# Patient Record
Sex: Male | Born: 1955 | Race: White | Hispanic: No | Smoking: Never smoker
Health system: Southern US, Community
[De-identification: ages and names within clinical notes are randomized; demographics above are authoritative.]

## PROBLEM LIST (undated history)

## (undated) DIAGNOSIS — Z86010 Personal history of colonic polyps: Secondary | ICD-10-CM

## (undated) DIAGNOSIS — R011 Cardiac murmur, unspecified: Secondary | ICD-10-CM

## (undated) DIAGNOSIS — E785 Hyperlipidemia, unspecified: Secondary | ICD-10-CM

## (undated) DIAGNOSIS — I10 Essential (primary) hypertension: Secondary | ICD-10-CM

## (undated) HISTORY — DX: Hyperlipidemia, unspecified: E78.5

## (undated) HISTORY — DX: Essential (primary) hypertension: I10

## (undated) HISTORY — DX: Personal history of colonic polyps: Z86.010

## (undated) HISTORY — PX: APPENDECTOMY: SHX54

## (undated) HISTORY — DX: Cardiac murmur, unspecified: R01.1

---

## 2006-08-21 ENCOUNTER — Ambulatory Visit: Payer: Self-pay | Admitting: Internal Medicine

## 2006-08-23 ENCOUNTER — Ambulatory Visit: Payer: Self-pay

## 2006-08-23 ENCOUNTER — Encounter: Payer: Self-pay | Admitting: Cardiology

## 2006-10-12 ENCOUNTER — Ambulatory Visit: Payer: Self-pay | Admitting: Internal Medicine

## 2006-10-12 LAB — CONVERTED CEMR LAB
ALT: 25 units/L (ref 0–40)
Albumin: 4.4 g/dL (ref 3.5–5.2)
Alkaline Phosphatase: 77 units/L (ref 39–117)
Bilirubin Urine: NEGATIVE
Glucose, Bld: 95 mg/dL (ref 70–99)
HDL: 64.2 mg/dL (ref >39.0)
LDL DIRECT: 154.5 mg/dL
Leukocytes, UA: NEGATIVE
Nitrite: NEGATIVE
Potassium: 4.2 meq/L (ref 3.5–5.1)
RBC: 5.24 M/uL (ref 4.22–5.81)
RDW: 13.1 % (ref 11.5–14.6)
Sodium: 141 meq/L (ref 135–145)
Specific Gravity, Urine: 1.025 (ref 1.000–1.03)
Total Bilirubin: 0.9 mg/dL (ref 0.3–1.2)
Total Protein, Urine: NEGATIVE mg/dL
Total Protein: 7.3 g/dL (ref 6.0–8.3)
Triglyceride fasting, serum: 63 mg/dL (ref 0–149)
Urine Glucose: NEGATIVE mg/dL
VLDL: 13 mg/dL (ref 0–40)
pH: 6.5 (ref 5.0–8.0)

## 2006-10-17 ENCOUNTER — Ambulatory Visit: Payer: Self-pay | Admitting: Internal Medicine

## 2006-11-26 ENCOUNTER — Ambulatory Visit: Payer: Self-pay | Admitting: Internal Medicine

## 2006-12-14 ENCOUNTER — Ambulatory Visit: Payer: Self-pay | Admitting: Internal Medicine

## 2008-09-29 ENCOUNTER — Ambulatory Visit: Payer: Self-pay | Admitting: Internal Medicine

## 2008-09-29 LAB — CONVERTED CEMR LAB
AST: 18 units/L (ref 0–37)
Alkaline Phosphatase: 60 units/L (ref 39–117)
BUN: 23 mg/dL (ref 6–23)
Basophils Absolute: 0 10*3/uL (ref 0.0–0.1)
Bilirubin, Direct: 0.2 mg/dL (ref 0.0–0.3)
Chloride: 108 meq/L (ref 96–112)
Eosinophils Absolute: 0.2 10*3/uL (ref 0.0–0.7)
Eosinophils Relative: 2.3 % (ref 0.0–5.0)
GFR calc non Af Amer: 74 mL/min
HDL: 67.2 mg/dL (ref >39.0)
Hemoglobin, Urine: NEGATIVE
MCV: 88.8 fL (ref 78.0–100.0)
Neutrophils Relative %: 64.2 % (ref 43.0–77.0)
Platelets: 164 10*3/uL (ref 150–400)
Potassium: 4.1 meq/L (ref 3.5–5.1)
Sodium: 144 meq/L (ref 135–145)
Total Bilirubin: 0.9 mg/dL (ref 0.3–1.2)
Urine Glucose: NEGATIVE mg/dL
Urobilinogen, UA: 0.2 (ref 0.0–1.0)
VLDL: 13 mg/dL (ref 0–40)
WBC: 8.1 10*3/uL (ref 4.5–10.5)

## 2008-10-05 ENCOUNTER — Ambulatory Visit: Payer: Self-pay | Admitting: Internal Medicine

## 2008-11-26 ENCOUNTER — Ambulatory Visit: Payer: Self-pay | Admitting: Internal Medicine

## 2008-11-26 DIAGNOSIS — R079 Chest pain, unspecified: Secondary | ICD-10-CM | POA: Insufficient documentation

## 2009-10-18 ENCOUNTER — Ambulatory Visit: Payer: Self-pay | Admitting: Internal Medicine

## 2009-10-18 LAB — CONVERTED CEMR LAB
Alkaline Phosphatase: 87 units/L (ref 39–117)
Basophils Relative: 0.1 % (ref 0.0–3.0)
Bilirubin Urine: NEGATIVE
Bilirubin, Direct: 0.1 mg/dL (ref 0.0–0.3)
CO2: 30 meq/L (ref 19–32)
Chloride: 107 meq/L (ref 96–112)
Direct LDL: 131.6 mg/dL
Eosinophils Absolute: 0.2 10*3/uL (ref 0.0–0.7)
Eosinophils Relative: 2 % (ref 0.0–5.0)
Glucose, Bld: 92 mg/dL (ref 70–99)
HDL: 78.3 mg/dL (ref >39.00)
Leukocytes, UA: NEGATIVE
Lymphocytes Relative: 17.5 % (ref 12.0–46.0)
MCHC: 34.3 g/dL (ref 30.0–36.0)
Neutrophils Relative %: 76 % (ref 43.0–77.0)
Nitrite: NEGATIVE
PSA: 1.32 ng/mL (ref 0.10–4.00)
Platelets: 163 10*3/uL (ref 150.0–400.0)
Potassium: 4.3 meq/L (ref 3.5–5.1)
RBC: 4.58 M/uL (ref 4.22–5.81)
Sodium: 145 meq/L (ref 135–145)
Specific Gravity, Urine: >=1.03 (ref 1.000–1.030)
Total Bilirubin: 0.8 mg/dL (ref 0.3–1.2)
Total Protein, Urine: NEGATIVE mg/dL
Total Protein: 6.9 g/dL (ref 6.0–8.3)
Triglycerides: 39 mg/dL (ref 0.0–149.0)
VLDL: 7.8 mg/dL (ref 0.0–40.0)
WBC: 10 10*3/uL (ref 4.5–10.5)
pH: 5.5 (ref 5.0–8.0)

## 2009-10-22 ENCOUNTER — Ambulatory Visit: Payer: Self-pay | Admitting: Internal Medicine

## 2010-10-20 ENCOUNTER — Ambulatory Visit: Payer: Self-pay | Admitting: Internal Medicine

## 2010-10-20 LAB — CONVERTED CEMR LAB
ALT: 19 units/L (ref 0–53)
AST: 20 units/L (ref 0–37)
BUN: 25 mg/dL — ABNORMAL HIGH (ref 6–23)
Basophils Relative: 0.6 % (ref 0.0–3.0)
Chloride: 101 meq/L (ref 96–112)
Cholesterol: 235 mg/dL — ABNORMAL HIGH (ref 0–200)
Direct LDL: 129.3 mg/dL
Eosinophils Relative: 4.3 % (ref 0.0–5.0)
GFR calc non Af Amer: 96.57 mL/min (ref >60)
HCT: 42.2 % (ref 39.0–52.0)
Hemoglobin: 14.2 g/dL (ref 13.0–17.0)
Leukocytes, UA: NEGATIVE
Lymphs Abs: 1.6 10*3/uL (ref 0.7–4.0)
MCV: 89.8 fL (ref 78.0–100.0)
Monocytes Absolute: 0.4 10*3/uL (ref 0.1–1.0)
Monocytes Relative: 5.8 % (ref 3.0–12.0)
Neutro Abs: 4.7 10*3/uL (ref 1.4–7.7)
PSA: 1.23 ng/mL (ref 0.10–4.00)
Potassium: 4.3 meq/L (ref 3.5–5.1)
RBC: 4.7 M/uL (ref 4.22–5.81)
Sodium: 134 meq/L — ABNORMAL LOW (ref 135–145)
Specific Gravity, Urine: >=1.03 (ref 1.000–1.030)
TSH: 1.72 microintl units/mL (ref 0.35–5.50)
Total Bilirubin: 0.8 mg/dL (ref 0.3–1.2)
Total CHOL/HDL Ratio: 3
Total Protein: 6.7 g/dL (ref 6.0–8.3)
Urine Glucose: NEGATIVE mg/dL
Urobilinogen, UA: 0.2 (ref 0.0–1.0)
VLDL: 11.8 mg/dL (ref 0.0–40.0)
WBC: 7.1 10*3/uL (ref 4.5–10.5)
pH: 5.5 (ref 5.0–8.0)

## 2010-10-25 ENCOUNTER — Ambulatory Visit: Payer: Self-pay | Admitting: Internal Medicine

## 2010-10-25 ENCOUNTER — Encounter: Payer: Self-pay | Admitting: Internal Medicine

## 2010-10-25 DIAGNOSIS — R030 Elevated blood-pressure reading, without diagnosis of hypertension: Secondary | ICD-10-CM

## 2010-10-25 DIAGNOSIS — L570 Actinic keratosis: Secondary | ICD-10-CM

## 2011-01-24 NOTE — Assessment & Plan Note (Signed)
Summary: PHYSICAL--STC   Vital Signs:  Patient profile:   55 year old male Height:      68 inches Weight:      173 pounds BMI:     26.40 Temp:     98.4 degrees F oral Pulse rate:   84 / minute Pulse rhythm:   regular Resp:     16 per minute BP sitting:   130 / 80  (left arm) Cuff size:   regular  Vitals Entered By: Lanier Prude, CMA(AAMA) (October 25, 2010 10:20 AM) CC: CPX Is Patient Diabetic? No Comments pt recently had EKG in 07/2010   CC:  CPX.  History of Present Illness: The patient presents for a preventive health examination   Current Medications (verified): 1)  Vitamin D3 1000 Unit  Tabs (Cholecalciferol) .[name].. 1 By Mouth Daily  Allergies (verified): No Known Drug Allergies  Past History:  Past Medical History: Last updated: 10/05/2008 Unremarkable  Past Surgical History: Last updated: 11/26/2008 Denies surgical history  Family History: Last updated: 10/05/2008 Well  Social History: Last updated: 10/22/2009 Occupation: pilot Married Never Smoked Alcohol use-no Regular exercise-yes - water skier  Review of Systems  The patient denies anorexia, fever, weight loss, weight gain, vision loss, decreased hearing, hoarseness, chest pain, syncope, dyspnea on exertion, peripheral edema, prolonged cough, headaches, hemoptysis, abdominal pain, melena, hematochezia, severe indigestion/heartburn, hematuria, incontinence, genital sores, muscle weakness, suspicious skin lesions, transient blindness, difficulty walking, depression, unusual weight change, abnormal bleeding, enlarged lymph nodes, angioedema, and breast masses.         BP is nl at home  Physical Exam  General:  Well-developed,well-nourished,in no acute distress; alert,appropriate and cooperative throughout examination Head:  Normocephalic and atraumatic without obvious abnormalities. No apparent alopecia or balding. Eyes:  No corneal or conjunctival inflammation noted. EOMI. Perrla. Ears:   External ear exam shows no significant lesions or deformities.  Otoscopic examination reveals clear canals, tympanic membranes are intact bilaterally without bulging, retraction, inflammation or discharge. Hearing is grossly normal bilaterally. Nose:  External nasal examination shows no deformity or inflammation. Nasal mucosa are pink and moist without lesions or exudates. Mouth:  Oral mucosa and oropharynx without lesions or exudates.  Teeth in good repair. Neck:  No deformities, masses, or tenderness noted. Lungs:  Normal respiratory effort, chest expands symmetrically. Lungs are clear to auscultation, no crackles or wheezes. Heart:  Normal rate and regular rhythm. S1 and S2 normal without gallop, murmur, click, rub or other extra sounds. Abdomen:  Bowel sounds positive,abdomen soft and non-tender without masses, organomegaly or hernias noted. Rectal:  No external abnormalities noted. Normal sphincter tone. No rectal masses or tenderness. G(-) Genitalia:  Testes bilaterally descended without nodularity, tenderness or masses. No scrotal masses or lesions. No penis lesions or urethral discharge. Prostate:  2+ enlarged.   Msk:  No deformity or scoliosis noted of thoracic or lumbar spine.   Extremities:  No clubbing, cyanosis, edema, or deformity noted with normal full range of motion of all joints.   Neurologic:  No cranial nerve deficits noted. Station and gait are normal. Plantar reflexes are down-going bilaterally. DTRs are symmetrical throughout. Sensory, motor and coordinative functions appear intact. Skin:  AKs Cervical Nodes:  No lymphadenopathy noted Inguinal Nodes:  No significant adenopathy Psych:  Cognition and judgment appear intact. Alert and cooperative with normal attention span and concentration. No apparent delusions, illusions, hallucinations   Impression & Recommendations:  Problem # 1:  WELL ADULT EXAM (ICD-V70.0) Assessment New Health and age related issues  were discussed.  Available screening tests and vaccinations were discussed as well. Healthy life style including good diet and exercise was discussed.  The labs were reviewed with the patient.  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 scar(s), incomplete removal, ect.  and benefits discussed     4  lesion(s) on face was/were treated with liqid N2 in usual fasion.  Tolerated well. Compl. none. Wound care instructions given.   Complete Medication List: 1)  Vitamin D3 1000 Unit Tabs (Cholecalciferol) .[name].. 1 by mouth daily 2)  Aspirin 81 Mg Tbec (Aspirin) .[name].. 1 by mouth qd  Other Orders: Cryotherapy/Destruction benign or premalignant lesion (1st lesion)  (17000) Cryotherapy/Destruction benign or premalignant lesion (2nd-14th lesions) (17003)  Patient Instructions: 1)  Please schedule a follow-up appointment in 1 year well exam.   Orders Added: 1)  EKG w/ Interpretation [93000] 2)  Cryotherapy/Destruction benign or premalignant lesion (1st lesion)  [17000] 3)  Cryotherapy/Destruction benign or premalignant lesion (2nd-14th lesions) [17003] 4)  Est. Patient 40-64 years [16109]

## 2011-05-12 NOTE — Assessment & Plan Note (Signed)
[facility]                             PRIMARY CARE OFFICE NOTE   NAME:Carpenter, Steven LEETH                        MRN:          [id number]  DATE:10/17/2006                            DOB:          February 22, 1956   Patient is a 55 year old male who presents for a wellness examination.   Past medical history, family history, social history as per August 19, 2002,  note.   ALLERGIES:  NO KNOWN DRUG ALLERGIES.   CURRENT MEDICATIONS:  None.   REVIEW OF SYSTEMS:  No chest pain or shortness of breath.  No syncope.  Chronic itching in the left ear.  Blood pressure at home varied for systolic  between 122 to 135 with diastolic 72-86, mostly normal.  He enjoys water  skiing without any problems.   PHYSICAL EXAMINATION:  GENERAL APPEARANCE:  He is in no acute distress.  Looks well.  VITAL SIGNS:  Blood pressure 131/93, pulse 74, temperature 97.9, weight 178.  HEENT:  Moist mucosa.  Left ear reveals swelling over the lower half with  decreased size of the ear canal.  The skin is slightly inflamed, no mass is  felt.  NECK:  Supple.  No thyromegaly or bruit.  LUNGS:  Clear.  No wheezes or rales.  CARDIOVASCULAR:  S1 and S2, grade 1/6 systolic murmur.  ABDOMEN:  Soft and nontender.  No organomegaly, no mass felt.  EXTREMITIES:  Lower extremities without edema.  NEUROLOGIC:  He is alert and cooperative.  Denies being depressed.  RECTAL:  Quite enlarged prostate.  No nodules, no masses.  Stool guaiac  negative.  SKIN:  Clear with four actinic keratosis lesion on the left temple.   LABORATORY DATA:  On October 12, 2006, CBC normal.  CMET normal.  Cholesterol 236, LDL 164, HDL 64, PSA 121.5.  Urinalysis normal.   ASSESSMENT/PLAN:  Normal wellness examination.  Age/health related issues  discussed.  Healthy lifestyle discussed.  He will take a baby aspirin daily.  He will start taking fish oil 2 capsules a day.  Schedule colonoscopy with  Dr. Russella Dar.  May need to get  a chest x-ray next year.  He refused a flu shot.  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            ______________________________  Georgina Quint. Plotnikov, MD    AVP/MedQ DD:  10/17/2006 DT:  10/18/2006 Job #:  [id number]

## 2011-05-12 NOTE — Assessment & Plan Note (Signed)
[facility]                             PRIMARY CARE OFFICE NOTE   NAME:Steven Carpenter, Steven Carpenter                        MRN:          [id number]  DATE:10/17/2006                            DOB:          1956-12-06    SEPARATE EVALUATION AND MANAGEMENT:   Today we need to address problems with elevated LDL, borderline blood  pressure and lesion.   ALLERGIES:  NO KNOWN DRUG ALLERGIES.   Past medical history, family history and social history as per August 19, 2002, note.   REVIEW OF SYSTEMS:  Itching of the left ear for years.  Blood pressure at  home with systolics between 122-134 and diastolics between 72-86.  No  palpitations.  No chest pain.   PHYSICAL EXAMINATION:  GENERAL APPEARANCE:  He is in no acute distress.  VITAL SIGNS:  Blood pressure 131/93, pulse 74, temperature 97.9, weight 178  pounds.  HEENT:  Moist mucosa.  Left ear with somewhat swollen and enlarged lower one  half.  Ear canal diminished in size.  Some scaling present.  NECK:  Supple.  No thyromegaly or bruit.  LUNGS:  Clear.  No wheezes or rales.  CARDIOVASCULAR:  S1 and S2 with grade 1/6 systolic murmur.  ABDOMEN:  Soft and nontender.  No organomegaly, no mass felt.  EXTREMITIES:  Lower extremities without edema.  NEUROLOGIC:  He is alert, oriented and cooperative.  Denies being depressed.  RECTAL:  Quite enlarged prostate, no nodules, no masses.  Stool guaiac  negative.  SKIN:  Four AK lesions on the left temple.   LABORATORY DATA:  On October 12, 2006, CBC normal, CMET normal.  Cholesterol  236, LDL 154, HDL 64, PSA 1.15.  Urinalysis normal.   ASSESSMENT/PLAN:  1. Elevated LDL.  He does not want to take prescription for it.  He will      try fish oil 2 capsules a day and aspirin 81 mg a day.  Will recheck in      1 year.  2. Borderline blood pressure.  He will continue with low salt diet.  He is      not interested in taking prescription.  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  3. Actinic keratosis left temple x4.  Will do cryosurgery.  Use sunscreen      lotion with SPF of 60.  4. Thickened left ear skin with chronic inflammation.  ENT consult with      Dr. Pollyann Kennedy.  Given prescription for triamcinolone 0.5% cream to use      b.i.d. and Cortisporin Otic drops to use three drops in the ear three      times a day for five days p.r.n.   PROCEDURE:  Cryosurgery.   INDICATIONS FOR PROCEDURE:  Actinic keratosis left temple.  Risks and  benefits explained to the patient in detail and he agreed to proceed.   Four lesion of the left temple were treated with liquid nitrogen in the  usual fashion.  Tolerated well.   COMPLICATIONS:  None.  ______________________________  Georgina Quint Plotnikov, MD      AVP/MedQ  DD:  10/17/2006  DT:  10/18/2006  Job #:  [id number]   cc:   Brett Canales A. Cleta Alberts, M.D.  Jefry H. Pollyann Kennedy, MD

## 2011-09-26 ENCOUNTER — Telehealth: Payer: Self-pay | Admitting: *Deleted

## 2011-09-26 DIAGNOSIS — Z0000 Encounter for general adult medical examination without abnormal findings: Secondary | ICD-10-CM

## 2011-09-26 DIAGNOSIS — Z0389 Encounter for observation for other suspected diseases and conditions ruled out: Secondary | ICD-10-CM

## 2011-09-26 NOTE — Telephone Encounter (Signed)
Message copied by Merrilyn Puma on Tue Sep 26, 2011  3:18 PM ------      Message from: Newell Coral      Created: Wed Sep 20, 2011  4:14 PM      Regarding: Physical - Need Labs       This pt has scheduled a physical and is hoping to get lab work done too.       Let me know if there is anything else I can do!             Thanks!

## 2011-09-26 NOTE — Telephone Encounter (Signed)
Labs entered.

## 2011-12-08 ENCOUNTER — Other Ambulatory Visit: Payer: Self-pay | Admitting: Internal Medicine

## 2011-12-08 ENCOUNTER — Other Ambulatory Visit (INDEPENDENT_AMBULATORY_CARE_PROVIDER_SITE_OTHER): Payer: Self-pay

## 2011-12-08 DIAGNOSIS — Z0389 Encounter for observation for other suspected diseases and conditions ruled out: Secondary | ICD-10-CM

## 2011-12-08 DIAGNOSIS — Z0000 Encounter for general adult medical examination without abnormal findings: Secondary | ICD-10-CM

## 2011-12-08 LAB — LIPID PANEL
Cholesterol: 246 mg/dL — ABNORMAL HIGH (ref 0–200)
HDL: 84.9 mg/dL (ref >39.00)
Total CHOL/HDL Ratio: 3
Triglycerides: 58 mg/dL (ref 0.0–149.0)

## 2011-12-08 LAB — URINALYSIS, ROUTINE W REFLEX MICROSCOPIC
Bilirubin Urine: NEGATIVE
Hgb urine dipstick: NEGATIVE
Ketones, ur: NEGATIVE
Leukocytes, UA: NEGATIVE
Nitrite: NEGATIVE
Specific Gravity, Urine: 1.02
Total Protein, Urine: NEGATIVE
Urine Glucose: NEGATIVE
Urobilinogen, UA: 0.2
pH: 6.5 (ref 5.0–8.0)

## 2011-12-08 LAB — HEPATIC FUNCTION PANEL
Albumin: 4.2 g/dL (ref 3.5–5.2)
Alkaline Phosphatase: 71 U/L (ref 39–117)
Bilirubin, Direct: 0.1 mg/dL (ref 0.0–0.3)
Total Protein: 7 g/dL (ref 6.0–8.3)

## 2011-12-08 LAB — TSH: TSH: 1.72 u[IU]/mL (ref 0.35–5.50)

## 2011-12-08 LAB — BASIC METABOLIC PANEL
CO2: 30 mEq/L (ref 19–32)
Calcium: 9 mg/dL (ref 8.4–10.5)
Creatinine, Ser: 0.8 mg/dL (ref 0.4–1.5)
GFR: 100.15 mL/min (ref >60.00)
Glucose, Bld: 85 mg/dL (ref 70–99)
Sodium: 141 mEq/L (ref 135–145)

## 2011-12-08 LAB — CBC WITH DIFFERENTIAL/PLATELET
Basophils Relative: 0.5 % (ref 0.0–3.0)
Eosinophils Relative: 2.1 % (ref 0.0–5.0)
Lymphocytes Relative: 24 % (ref 12.0–46.0)
Monocytes Absolute: 0.4 10*3/uL (ref 0.1–1.0)
Neutrophils Relative %: 67.1 % (ref 43.0–77.0)
Platelets: 176 10*3/uL (ref 150.0–400.0)
RBC: 4.93 Mil/uL (ref 4.22–5.81)
WBC: 6.8 10*3/uL (ref 4.5–10.5)

## 2011-12-08 LAB — LDL CHOLESTEROL, DIRECT: Direct LDL: 137.3 mg/dL

## 2011-12-12 ENCOUNTER — Encounter: Payer: Self-pay | Admitting: Internal Medicine

## 2011-12-13 ENCOUNTER — Ambulatory Visit (INDEPENDENT_AMBULATORY_CARE_PROVIDER_SITE_OTHER): Payer: 59 | Admitting: Internal Medicine

## 2011-12-13 ENCOUNTER — Encounter: Payer: Self-pay | Admitting: Internal Medicine

## 2011-12-13 VITALS — BP 140/85 | HR 72 | Temp 98.0°F | Resp 16 | Wt 181.0 lb

## 2011-12-13 DIAGNOSIS — Z0000 Encounter for general adult medical examination without abnormal findings: Secondary | ICD-10-CM

## 2011-12-13 DIAGNOSIS — Z136 Encounter for screening for cardiovascular disorders: Secondary | ICD-10-CM

## 2011-12-13 DIAGNOSIS — D485 Neoplasm of uncertain behavior of skin: Secondary | ICD-10-CM

## 2011-12-13 NOTE — Patient Instructions (Signed)
Normal BP<130/85 

## 2011-12-13 NOTE — Assessment & Plan Note (Signed)
We discussed age appropriate health related issues, including available/recomended screening tests and vaccinations. We discussed a need for adhering to healthy diet and exercise. Labs/EKG were reviewed/ordered. All questions were answered.   

## 2011-12-13 NOTE — Progress Notes (Signed)
  Subjective:    Patient ID: Steven Carpenter, male    DOB: 10/12/1956, 55 y.o.   MRN: [id number]  HPI The patient is here for a wellness exam. The patient has been doing well overall without major physical or psychological issues going on lately.  Review of Systems  Constitutional: Negative for appetite change, fatigue and unexpected weight change.  HENT: Negative for nosebleeds, congestion, sore throat, sneezing, trouble swallowing and neck pain.   Eyes: Negative for itching and visual disturbance.  Respiratory: Negative for cough.   Cardiovascular: Negative for chest pain, palpitations and leg swelling.  Gastrointestinal: Negative for nausea, diarrhea, blood in stool and abdominal distention.  Genitourinary: Negative for frequency and hematuria.  Musculoskeletal: Negative for back pain, joint swelling and gait problem.  Skin: Negative for rash.  Neurological: Negative for dizziness, tremors, seizures, speech difficulty, weakness, light-headedness and numbness.  Psychiatric/Behavioral: Negative for suicidal ideas, sleep disturbance, dysphoric mood and agitation. The patient is not nervous/anxious.        Objective:   Physical Exam  Constitutional: He is oriented to person, place, and time. He appears well-developed and well-nourished. No distress.  HENT:  Head: Normocephalic and atraumatic.  Right Ear: External ear normal.  Left Ear: External ear normal.  Nose: Nose normal.  Mouth/Throat: Oropharynx is clear and moist. No oropharyngeal exudate.  Eyes: Conjunctivae and EOM are normal. Pupils are equal, round, and reactive to light. Right eye exhibits no discharge. Left eye exhibits no discharge. No scleral icterus.  Neck: Normal range of motion. Neck supple. No JVD present. No tracheal deviation present. No thyromegaly present.  Cardiovascular: Normal rate, regular rhythm, normal heart sounds and intact distal pulses.  Exam reveals no gallop and no friction rub.   No murmur  heard. Pulmonary/Chest: Effort normal and breath sounds normal. No stridor. No respiratory distress. He has no wheezes. He has no rales. He exhibits no tenderness.  Abdominal: Soft. Bowel sounds are normal. He exhibits no distension and no mass. There is no tenderness. There is no rebound and no guarding.  Genitourinary: Rectum normal, prostate normal and penis normal. Guaiac negative stool. No penile tenderness.  Musculoskeletal: Normal range of motion. He exhibits no edema and no tenderness.  Lymphadenopathy:    He has no cervical adenopathy.  Neurological: He is alert and oriented to person, place, and time. He has normal reflexes. No cranial nerve deficit. He exhibits normal muscle tone. Coordination normal.  Skin: Skin is warm and dry. No rash noted. He is not diaphoretic. No erythema. No pallor.       12/12 L cheek, R flank  Psychiatric: He has a normal mood and affect. His behavior is normal. Judgment and thought content normal.          Lab Results  Component Value Date   WBC 6.8 12/08/2011   HGB 14.9 12/08/2011   HCT 44.3 12/08/2011   PLT 176.0 12/08/2011   GLUCOSE 85 12/08/2011   CHOL 246* 12/08/2011   TRIG 58.0 12/08/2011   HDL 84.90 12/08/2011   LDLDIRECT 137.3 12/08/2011   ALT 16 12/08/2011   AST 16 12/08/2011   NA 141 12/08/2011   K 4.6 12/08/2011   CL 105 12/08/2011   CREATININE 0.8 12/08/2011   BUN 22 12/08/2011   CO2 30 12/08/2011   TSH 1.72 12/08/2011   PSA 1.48 12/08/2011    Assessment & Plan:

## 2011-12-17 NOTE — Assessment & Plan Note (Signed)
12/12 L cheek, R flank Skin bx w/me

## 2012-05-27 ENCOUNTER — Encounter: Payer: Self-pay | Admitting: Internal Medicine

## 2012-05-27 ENCOUNTER — Ambulatory Visit (INDEPENDENT_AMBULATORY_CARE_PROVIDER_SITE_OTHER): Payer: 59 | Admitting: Internal Medicine

## 2012-05-27 VITALS — BP 150/108 | HR 80 | Temp 98.1°F | Resp 16 | Ht 68.0 in | Wt 188.0 lb

## 2012-05-27 DIAGNOSIS — R030 Elevated blood-pressure reading, without diagnosis of hypertension: Secondary | ICD-10-CM

## 2012-05-27 MED ORDER — LOSARTAN POTASSIUM 100 MG PO TABS: 100.0000 mg | ORAL_TABLET | Freq: Every day | ORAL | Status: DC

## 2012-05-27 NOTE — Patient Instructions (Signed)
BP Readings from Last 3 Encounters:  05/27/12 150/108  12/13/11 140/85  10/25/10 130/80   Wt Readings from Last 3 Encounters:  05/27/12 188 lb (85.276 kg)  12/13/11 181 lb (82.101 kg)  10/25/10 173 lb (78.472 kg)

## 2012-05-27 NOTE — Assessment & Plan Note (Addendum)
2012, worse in 2013 Start Losartan Labs in 3 mo

## 2012-05-27 NOTE — Progress Notes (Signed)
Patient ID: Steven Carpenter, male   DOB: 1956-10-08, 56 y.o.   MRN: [id number]  Subjective:    Patient ID: Steven Carpenter, male    DOB: Apr 26, 1956, 56 y.o.   MRN: [id number]  Hypertension Pertinent negatives include no chest pain, neck pain or palpitations.   BP Readings from Last 3 Encounters:  05/27/12 150/108  12/13/11 140/85  10/25/10 130/80   Wt Readings from Last 3 Encounters:  05/27/12 188 lb (85.276 kg)  12/13/11 181 lb (82.101 kg)  10/25/10 173 lb (78.472 kg)     Review of Systems  Constitutional: Negative for appetite change, fatigue and unexpected weight change.  HENT: Negative for nosebleeds, congestion, sore throat, sneezing, trouble swallowing and neck pain.   Eyes: Negative for itching and visual disturbance.  Respiratory: Negative for cough.   Cardiovascular: Negative for chest pain, palpitations and leg swelling.  Gastrointestinal: Negative for nausea, diarrhea, blood in stool and abdominal distention.  Genitourinary: Negative for frequency and hematuria.  Musculoskeletal: Negative for back pain, joint swelling and gait problem.  Skin: Negative for rash.  Neurological: Negative for dizziness, tremors, seizures, speech difficulty, weakness, light-headedness and numbness.  Psychiatric/Behavioral: Negative for suicidal ideas, sleep disturbance, dysphoric mood and agitation. The patient is not nervous/anxious.        Objective:   Physical Exam  Constitutional: He is oriented to person, place, and time. He appears well-developed and well-nourished. No distress.  HENT:  Head: Normocephalic and atraumatic.  Right Ear: External ear normal.  Left Ear: External ear normal.  Nose: Nose normal.  Mouth/Throat: Oropharynx is clear and moist. No oropharyngeal exudate.  Eyes: Conjunctivae and EOM are normal. Pupils are equal, round, and reactive to light. Right eye exhibits no discharge. Left eye exhibits no discharge. No scleral icterus.  Neck: Normal range of motion. Neck  supple. No JVD present. No tracheal deviation present. No thyromegaly present.  Cardiovascular: Normal rate, regular rhythm, normal heart sounds and intact distal pulses.  Exam reveals no gallop and no friction rub.   No murmur heard. Pulmonary/Chest: Effort normal and breath sounds normal. No stridor. No respiratory distress. He has no wheezes. He has no rales. He exhibits no tenderness.  Abdominal: Soft. Bowel sounds are normal. He exhibits no distension and no mass. There is no tenderness. There is no rebound and no guarding.  Genitourinary: Rectum normal, prostate normal and penis normal. Guaiac negative stool. No penile tenderness.  Musculoskeletal: Normal range of motion. He exhibits no edema and no tenderness.  Lymphadenopathy:    He has no cervical adenopathy.  Neurological: He is alert and oriented to person, place, and time. He has normal reflexes. No cranial nerve deficit. He exhibits normal muscle tone. Coordination normal.  Skin: Skin is warm and dry. No rash noted. He is not diaphoretic. No erythema. No pallor.       12/12 L cheek, R flank  Psychiatric: He has a normal mood and affect. His behavior is normal. Judgment and thought content normal.          Lab Results  Component Value Date   WBC 6.8 12/08/2011   HGB 14.9 12/08/2011   HCT 44.3 12/08/2011   PLT 176.0 12/08/2011   GLUCOSE 85 12/08/2011   CHOL 246* 12/08/2011   TRIG 58.0 12/08/2011   HDL 84.90 12/08/2011   LDLDIRECT 137.3 12/08/2011   ALT 16 12/08/2011   AST 16 12/08/2011   NA 141 12/08/2011   K 4.6 12/08/2011   CL 105 12/08/2011   CREATININE  0.8 12/08/2011   BUN 22 12/08/2011   CO2 30 12/08/2011   TSH 1.72 12/08/2011   PSA 1.48 12/08/2011    Assessment & Plan:

## 2012-06-12 ENCOUNTER — Ambulatory Visit: Payer: 59 | Admitting: Internal Medicine

## 2012-06-19 ENCOUNTER — Ambulatory Visit (INDEPENDENT_AMBULATORY_CARE_PROVIDER_SITE_OTHER): Payer: 59 | Admitting: Internal Medicine

## 2012-06-19 VITALS — BP 140/90 | HR 88 | Temp 97.4°F | Resp 16 | Wt 186.0 lb

## 2012-06-19 DIAGNOSIS — R030 Elevated blood-pressure reading, without diagnosis of hypertension: Secondary | ICD-10-CM

## 2012-06-19 DIAGNOSIS — K4090 Unilateral inguinal hernia, without obstruction or gangrene, not specified as recurrent: Secondary | ICD-10-CM

## 2012-06-19 DIAGNOSIS — K4020 Bilateral inguinal hernia, without obstruction or gangrene, not specified as recurrent: Secondary | ICD-10-CM | POA: Insufficient documentation

## 2012-06-19 NOTE — Progress Notes (Signed)
  Subjective:    Patient ID: Steven Carpenter, male    DOB: 1956-01-30, 56 y.o.   MRN: [id number]  HPI  C/o R groin bulge x 2 wks, NT  Review of Systems  Constitutional: Negative for fever, chills and activity change.  Respiratory: Negative for cough.   Cardiovascular: Negative for leg swelling.  Gastrointestinal: Negative for diarrhea, constipation and abdominal distention.       Objective:   Physical Exam  Constitutional: He appears well-nourished. No distress.  HENT:  Head: Normocephalic.  Eyes: Right eye exhibits no discharge.  Cardiovascular: Normal rate and normal heart sounds.   Pulmonary/Chest: He has no wheezes. He has no rales.  Abdominal: He exhibits mass (R ing hernia). He exhibits no distension. There is no tenderness.  Lymphadenopathy:    He has no cervical adenopathy.  Skin: No rash noted. He is not diaphoretic.          Assessment & Plan:

## 2012-06-20 NOTE — Assessment & Plan Note (Signed)
He is on Rx. Will re-check

## 2012-06-20 NOTE — Assessment & Plan Note (Signed)
Surgical consult We discussed treatment

## 2012-07-17 ENCOUNTER — Ambulatory Visit (INDEPENDENT_AMBULATORY_CARE_PROVIDER_SITE_OTHER): Payer: 59 | Admitting: Surgery

## 2012-07-17 ENCOUNTER — Encounter (INDEPENDENT_AMBULATORY_CARE_PROVIDER_SITE_OTHER): Payer: Self-pay | Admitting: Surgery

## 2012-07-17 VITALS — BP 160/90 | HR 60 | Temp 98.8°F | Resp 16 | Ht 68.0 in | Wt 187.0 lb

## 2012-07-17 DIAGNOSIS — K4020 Bilateral inguinal hernia, without obstruction or gangrene, not specified as recurrent: Secondary | ICD-10-CM

## 2012-07-17 NOTE — Progress Notes (Signed)
Subjective:     Patient ID: Steven Carpenter, male   DOB: [birth year], 56 y.o.   MRN: [id number]  HPI  [name]  06-30-1956 [id number]  Patient Care Team: Tresa Garter, MD as PCP - General  This patient is a 56 y.o.male who presents today for surgical evaluation at the request of Dr. Posey Rea.   Reason for visit: Right groin swelling probable hernia.  Patient is a pleasant active male.  He comes today with his wife.  A month ago he noted swelling in his right groin.  He mentioned it to his primary care physician.  Dr. Posey Rea was concerned about inguinal hernia and sent the patient to [place].  He has had a appendectomy done as a child otherwise no surgeries.  No fevers or chills.  Daily bowel movements.  Normal colonoscopy six years ago.  No history of skin infections.  He's quite active.  Patient Active Problem List  Diagnosis  . ACTINIC KERATOSIS  . CHEST PAIN  . ELEVATED BLOOD PRESSURE  . Well adult exam  . Neoplasm of uncertain behavior of skin  . Bilateral inguinal hernia (BIH), R>L    Past Medical History  Diagnosis Date  . Heart murmur   . Hypertension     Past Surgical History  Procedure Date  . Appendectomy     History   Social History  . Marital Status: Married    Spouse Name: N/A    Number of Children: N/A  . Years of Education: N/A   Occupational History  . Not on file.   Social History Main Topics  . Smoking status: Never Smoker   . Smokeless tobacco: Never Used  . Alcohol Use: 0.0 oz/week    1-2 Cans of beer per week  . Drug Use: No  . Sexually Active: Yes -- Male partner(s)   Other Topics Concern  . 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 Hypertension Mother   . Cancer Mother     breast  . Hypertension Father     Current Outpatient Prescriptions  Medication Sig Dispense Refill  . aspirin 81 MG tablet Take  81 mg by mouth daily.        . beta carotene w/minerals (OCUVITE) tablet Take 1 tablet by mouth daily.      . fish oil-omega-3 fatty acids 1000 MG capsule Take 2 g by mouth daily.      [name] losartan (COZAAR) 100 MG tablet Take 1 tablet (100 mg total) by mouth daily.  90 tablet  3  . Multiple Vitamin (MULTIVITAMIN) tablet Take 1 tablet by mouth daily.      . Cholecalciferol 1000 UNITS tablet Take 1,000 Units by mouth daily.           No Known Allergies  BP 160/90  Pulse 60  Temp 98.8 F (37.1 C) (Temporal)  Resp 16  Ht 5\' 8"  (1.727 m)  Wt 187 lb (84.823 kg)  BMI 28.43 kg/m2  No results found.   Review of Systems  Constitutional: Negative for fever, chills and diaphoresis.  HENT: Negative for nosebleeds, sore throat, facial swelling, mouth sores, trouble swallowing and ear discharge.   Eyes: Negative for photophobia, discharge and visual disturbance.  Respiratory: Negative for choking, chest tightness, shortness of breath and stridor.   Cardiovascular: Negative for chest pain and palpitations.  Gastrointestinal:  Negative for nausea, vomiting, abdominal pain, diarrhea, constipation, blood in stool, abdominal distention, anal bleeding and rectal pain.  Genitourinary: Negative for dysuria, urgency, difficulty urinating and testicular pain.  Musculoskeletal: Negative for myalgias, back pain, arthralgias and gait problem.  Skin: Negative for color change, pallor, rash and wound.  Neurological: Negative for dizziness, speech difficulty, weakness, numbness and headaches.  Hematological: Negative for adenopathy. Does not bruise/bleed easily.  Psychiatric/Behavioral: Negative for hallucinations, confusion and agitation.       Objective:   Physical Exam  Constitutional: He is oriented to person, place, and time. He appears well-developed and well-nourished. No distress.  HENT:  Head: Normocephalic.  Mouth/Throat: Oropharynx is clear and moist. No oropharyngeal exudate.  Eyes:  Conjunctivae and EOM are normal. Pupils are equal, round, and reactive to light. No scleral icterus.  Neck: Normal range of motion. Neck supple. No tracheal deviation present.  Cardiovascular: Normal rate, regular rhythm and intact distal pulses.   Pulmonary/Chest: Effort normal and breath sounds normal. No respiratory distress.  Abdominal: Soft. He exhibits no distension. There is no tenderness. A hernia is present. Hernia confirmed positive in the right inguinal area and confirmed positive in the left inguinal area.    Genitourinary: Testes normal. Circumcised.  Musculoskeletal: Normal range of motion. He exhibits no tenderness.  Lymphadenopathy:    He has no cervical adenopathy.       Right: No inguinal adenopathy present.       Left: No inguinal adenopathy present.  Neurological: He is alert and oriented to person, place, and time. No cranial nerve deficit. He exhibits normal muscle tone. Coordination normal.  Skin: Skin is warm and dry. No rash noted. He is not diaphoretic. No erythema. No pallor.  Psychiatric: He has a normal mood and affect. His behavior is normal. Judgment and thought content normal.       Assessment:     BIH (R>L)    Plan:     Lap repair:  The anatomy & physiology of the abdominal wall and pelvic floor was discussed.  The pathophysiology of hernias in the inguinal and pelvic region was discussed.  Natural history risks such as progressive enlargement, pain, incarceration & strangulation was discussed.   Contributors to complications such as smoking, obesity, diabetes, prior surgery, etc were discussed.    I feel the risks of no intervention will lead to serious problems that outweigh the operative risks; therefore, I recommended surgery to reduce and repair the hernia.  I explained laparoscopic techniques with possible need for an open approach.  I noted usual use of mesh to patch and/or buttress hernia repair  Risks such as bleeding, infection, abscess, need  for further treatment, heart attack, death, and other risks were discussed.  I noted a good likelihood this will help address the problem.   Goals of post-operative recovery were discussed as well.  Possibility that this will not correct all symptoms was explained.  I stressed the importance of low-impact activity, aggressive pain control, avoiding constipation, & not pushing through pain to minimize risk of post-operative chronic pain or injury. Possibility of reherniation was discussed.  We will work to minimize complications.     An educational handout further explaining the pathology & treatment options was given as well.  Questions were answered.  The patient expresses understanding & wishes to proceed with surgery.

## 2012-07-17 NOTE — Patient Instructions (Addendum)
See the Handout(s) we gave you.  Consider surgery.  Please call our office at [phone] if you wish to schedule surgery or if you have further questions / concerns.    Hernia A hernia occurs when an internal organ pushes out through a weak spot in the abdominal wall. Hernias most commonly occur in the groin and around the navel. Hernias often can be pushed back into place (reduced). Most hernias tend to get worse over time. Some abdominal hernias can get stuck in the opening (irreducible or incarcerated hernia) and cannot be reduced. An irreducible abdominal hernia which is tightly squeezed into the opening is at risk for impaired blood supply (strangulated hernia). A strangulated hernia is a medical emergency. Because of the risk for an irreducible or strangulated hernia, surgery may be recommended to repair a hernia. CAUSES   Heavy lifting.   Prolonged coughing.   Straining to have a bowel movement.   A cut (incision) made during an abdominal surgery.  HOME CARE INSTRUCTIONS   Bed rest is not required. You may continue your normal activities.   Avoid lifting more than 10 pounds (4.5 kg) or straining.   Cough gently. If you are a smoker it is best to stop. Even the best hernia repair can break down with the continual strain of coughing. Even if you do not have your hernia repaired, a cough will continue to aggravate the problem.   Do not wear anything tight over your hernia. Do not try to keep it in with an outside bandage or truss. These can damage abdominal contents if they are trapped within the hernia sac.   Eat a normal diet.   Avoid constipation. Straining over long periods of time will increase hernia size and encourage breakdown of repairs. If you cannot do this with diet alone, stool softeners may be used.  SEEK IMMEDIATE MEDICAL CARE IF:   You have a fever.   You develop increasing abdominal pain.   You feel nauseous or vomit.   Your hernia is stuck outside the  abdomen, looks discolored, feels hard, or is tender.   You have any changes in your bowel habits or in the hernia that are unusual for you.   You have increased pain or swelling around the hernia.   You cannot push the hernia back in place by applying gentle pressure while lying down.  MAKE SURE YOU:   Understand these instructions.   Will watch your condition.   Will get help right away if you are not doing well or get worse.  Document Released: 12/11/2005 Document Revised: 11/30/2011 Document Reviewed: 07/30/2008 [employer] Patient Information 2012 [employer]. 

## 2012-08-06 DIAGNOSIS — K4020 Bilateral inguinal hernia, without obstruction or gangrene, not specified as recurrent: Secondary | ICD-10-CM

## 2012-08-06 HISTORY — PX: INGUINAL HERNIA REPAIR: SUR1180

## 2012-08-20 ENCOUNTER — Telehealth: Payer: Self-pay | Admitting: Internal Medicine

## 2012-08-20 DIAGNOSIS — R030 Elevated blood-pressure reading, without diagnosis of hypertension: Secondary | ICD-10-CM

## 2012-08-20 NOTE — Telephone Encounter (Signed)
Pt is aware.  Wants to come on Thursday to do the labs.  Please put them in the system.

## 2012-08-20 NOTE — Telephone Encounter (Signed)
Per 05/27/2012 OV pt is supposed to have 3 mth lab follow up on BP medication. Please advise on lab that needs to be ordered prior to appt 08/27/2012

## 2012-08-20 NOTE — Telephone Encounter (Signed)
BMET pls Thx

## 2012-08-22 ENCOUNTER — Other Ambulatory Visit (INDEPENDENT_AMBULATORY_CARE_PROVIDER_SITE_OTHER): Payer: 59

## 2012-08-22 DIAGNOSIS — R030 Elevated blood-pressure reading, without diagnosis of hypertension: Secondary | ICD-10-CM

## 2012-08-22 LAB — BASIC METABOLIC PANEL
BUN: 24 mg/dL — ABNORMAL HIGH (ref 6–23)
Chloride: 105 mEq/L (ref 96–112)
Creatinine, Ser: 0.9 mg/dL (ref 0.4–1.5)
Glucose, Bld: 93 mg/dL (ref 70–99)
Potassium: 4.2 mEq/L (ref 3.5–5.1)

## 2012-08-27 ENCOUNTER — Encounter: Payer: Self-pay | Admitting: Internal Medicine

## 2012-08-27 ENCOUNTER — Ambulatory Visit (INDEPENDENT_AMBULATORY_CARE_PROVIDER_SITE_OTHER): Payer: 59 | Admitting: Internal Medicine

## 2012-08-27 VITALS — BP 148/98 | HR 80 | Temp 97.8°F | Resp 16 | Wt 189.0 lb

## 2012-08-27 DIAGNOSIS — L570 Actinic keratosis: Secondary | ICD-10-CM

## 2012-08-27 DIAGNOSIS — I10 Essential (primary) hypertension: Secondary | ICD-10-CM

## 2012-08-27 DIAGNOSIS — K4020 Bilateral inguinal hernia, without obstruction or gangrene, not specified as recurrent: Secondary | ICD-10-CM

## 2012-08-27 DIAGNOSIS — R030 Elevated blood-pressure reading, without diagnosis of hypertension: Secondary | ICD-10-CM

## 2012-08-27 MED ORDER — AMLODIPINE-OLMESARTAN 5-40 MG PO TABS: 1.0000 | ORAL_TABLET | Freq: Every day | ORAL | Status: DC

## 2012-08-27 NOTE — Assessment & Plan Note (Signed)
Change to Azor 40/5 qd

## 2012-08-27 NOTE — Assessment & Plan Note (Signed)
6/13 B S/p B repair Dr Michaell Cowing

## 2012-08-27 NOTE — Assessment & Plan Note (Signed)
Cryo suggested 

## 2012-08-27 NOTE — Progress Notes (Signed)
Subjective:    Patient ID: Steven Carpenter, male    DOB: 02-Nov-1956, 56 y.o.   MRN: [id number]  Hypertension Pertinent negatives include no chest pain, neck pain or palpitations.   BP Readings from Last 3 Encounters:  08/27/12 148/98  07/17/12 160/90  06/19/12 140/90   Wt Readings from Last 3 Encounters:  08/27/12 189 lb (85.73 kg)  07/17/12 187 lb (84.823 kg)  06/19/12 186 lb (84.369 kg)     Review of Systems  Constitutional: Negative for appetite change, fatigue and unexpected weight change.  HENT: Negative for nosebleeds, congestion, sore throat, sneezing, trouble swallowing and neck pain.   Eyes: Negative for itching and visual disturbance.  Respiratory: Negative for cough.   Cardiovascular: Negative for chest pain, palpitations and leg swelling.  Gastrointestinal: Negative for nausea, diarrhea, blood in stool and abdominal distention.  Genitourinary: Negative for frequency and hematuria.  Musculoskeletal: Negative for back pain, joint swelling and gait problem.  Skin: Negative for rash.  Neurological: Negative for dizziness, tremors, seizures, speech difficulty, weakness, light-headedness and numbness.  Psychiatric/Behavioral: Negative for suicidal ideas, disturbed wake/sleep cycle, dysphoric mood and agitation. The patient is not nervous/anxious.        Objective:   Physical Exam  Constitutional: He is oriented to person, place, and time. He appears well-developed and well-nourished. No distress.  HENT:  Head: Normocephalic and atraumatic.  Right Ear: External ear normal.  Left Ear: External ear normal.  Nose: Nose normal.  Mouth/Throat: Oropharynx is clear and moist. No oropharyngeal exudate.  Eyes: Conjunctivae and EOM are normal. Pupils are equal, round, and reactive to light. Right eye exhibits no discharge. Left eye exhibits no discharge. No scleral icterus.  Neck: Normal range of motion. Neck supple. No JVD present. No tracheal deviation present. No thyromegaly  present.  Cardiovascular: Normal rate, regular rhythm, normal heart sounds and intact distal pulses.  Exam reveals no gallop and no friction rub.   No murmur heard. Pulmonary/Chest: Effort normal and breath sounds normal. No stridor. No respiratory distress. He has no wheezes. He has no rales. He exhibits no tenderness.  Abdominal: Soft. Bowel sounds are normal. He exhibits no distension and no mass. There is no tenderness. There is no rebound and no guarding.  Genitourinary: Rectum normal, prostate normal and penis normal. Guaiac negative stool. No penile tenderness.  Musculoskeletal: Normal range of motion. He exhibits no edema and no tenderness.  Lymphadenopathy:    He has no cervical adenopathy.  Neurological: He is alert and oriented to person, place, and time. He has normal reflexes. No cranial nerve deficit. He exhibits normal muscle tone. Coordination normal.  Skin: Skin is warm and dry. No rash noted. He is not diaphoretic. No erythema. No pallor.       12/12 L cheek, R flank  Psychiatric: He has a normal mood and affect. His behavior is normal. Judgment and thought content normal.  hernia was repaired        Lab Results  Component Value Date   WBC 6.8 12/08/2011   HGB 14.9 12/08/2011   HCT 44.3 12/08/2011   PLT 176.0 12/08/2011   GLUCOSE 93 08/22/2012   CHOL 246* 12/08/2011   TRIG 58.0 12/08/2011   HDL 84.90 12/08/2011   LDLDIRECT 137.3 12/08/2011   ALT 16 12/08/2011   AST 16 12/08/2011   NA 138 08/22/2012   K 4.2 08/22/2012   CL 105 08/22/2012   CREATININE 0.9 08/22/2012   BUN 24* 08/22/2012   CO2 28 08/22/2012  TSH 1.72 12/08/2011   PSA 1.48 12/08/2011    Assessment & Plan:

## 2012-08-27 NOTE — Assessment & Plan Note (Signed)
Start Azor 5/40

## 2012-08-28 ENCOUNTER — Encounter (INDEPENDENT_AMBULATORY_CARE_PROVIDER_SITE_OTHER): Payer: Self-pay | Admitting: Surgery

## 2012-08-28 ENCOUNTER — Ambulatory Visit (INDEPENDENT_AMBULATORY_CARE_PROVIDER_SITE_OTHER): Payer: 59 | Admitting: Surgery

## 2012-08-28 VITALS — BP 136/88 | HR 56 | Temp 98.2°F | Resp 14 | Ht 68.5 in | Wt 189.2 lb

## 2012-08-28 DIAGNOSIS — K4020 Bilateral inguinal hernia, without obstruction or gangrene, not specified as recurrent: Secondary | ICD-10-CM

## 2012-08-28 NOTE — Progress Notes (Signed)
Subjective:     Patient ID: Steven Carpenter, male   DOB: 12-30-1955, 56 y.o.   MRN: [id number]  HPI  Steven Carpenter  01-06-1956 [id number]  Patient Care Team: Tresa Garter, MD as PCP - General  This patient is a 56 y.o.male who presents today for surgical evaluation.   Patient returns after three weeks from bilateral inguinal repairs.  Soreness down.  Bruising gone.  Hoping to start water skiing soon.  No fevers or chills.  Energy level good.  Regular bowel movements.  Urinating fine.  Patient Active Problem List  Diagnosis  . Actinic keratosis  . CHEST PAIN  . ELEVATED BLOOD PRESSURE  . Well adult exam  . Neoplasm of uncertain behavior of skin  . Bilateral inguinal hernia (BIH), R>L  . Hypertension    Past Medical History  Diagnosis Date  . Heart murmur   . Hypertension     Past Surgical History  Procedure Date  . Appendectomy   . Inguinal hernia repair 08/06/12    bilateral    History   Social History  . Marital Status: Married    Spouse Name: N/A    Number of Children: N/A  . Years of Education: N/A   Occupational History  . Not on file.   Social History Main Topics  . Smoking status: Never Smoker   . Smokeless tobacco: Never Used  . Alcohol Use: 0.0 oz/week    1-2 Cans of beer per week  . Drug Use: No  . Sexually Active: Yes -- Male partner(s)   Other Topics Concern  . 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 Hypertension Mother   . Cancer Mother     breast  . Hypertension Father     Current Outpatient Prescriptions  Medication Sig Dispense Refill  . amLODipine-olmesartan (AZOR) 5-40 MG per tablet Take 1 tablet by mouth daily.  30 tablet  11  . aspirin 81 MG tablet Take 81 mg by mouth daily.        . beta carotene w/minerals (OCUVITE) tablet Take 1 tablet by mouth daily.      . Cholecalciferol 1000 UNITS tablet Take  1,000 Units by mouth daily.        . fish oil-omega-3 fatty acids 1000 MG capsule Take 2 g by mouth daily.      . Multiple Vitamin (MULTIVITAMIN) tablet Take 1 tablet by mouth daily.         No Known Allergies  BP 136/88  Pulse 56  Temp 98.2 F (36.8 C) (Temporal)  Resp 14  Ht 5' 8.5" (1.74 m)  Wt 189 lb 3.2 oz (85.821 kg)  BMI 28.35 kg/m2  No results found.   Review of Systems  Constitutional: Negative for fever, chills and diaphoresis.  HENT: Negative for sore throat, trouble swallowing and neck pain.   Eyes: Negative for photophobia and visual disturbance.  Respiratory: Negative for choking and shortness of breath.   Cardiovascular: Negative for chest pain and palpitations.  Gastrointestinal: Negative for nausea, vomiting, abdominal distention, anal bleeding and rectal pain.  Genitourinary: Negative for dysuria, urgency, difficulty urinating and testicular pain.  Musculoskeletal: Negative for myalgias, arthralgias and gait problem.  Skin: Negative for color change and rash.  Neurological: Negative for dizziness, speech difficulty, weakness and numbness.  Hematological: Negative for adenopathy.  Psychiatric/Behavioral: Negative for  hallucinations, confusion and agitation.       Objective:   Physical Exam  Constitutional: He is oriented to person, place, and time. He appears well-developed and well-nourished. No distress.  HENT:  Head: Normocephalic.  Mouth/Throat: Oropharynx is clear and moist. No oropharyngeal exudate.  Eyes: Conjunctivae and EOM are normal. Pupils are equal, round, and reactive to light. No scleral icterus.  Neck: Normal range of motion. No tracheal deviation present.  Cardiovascular: Normal rate, normal heart sounds and intact distal pulses.   Pulmonary/Chest: Effort normal. No respiratory distress.  Abdominal: Soft. He exhibits no distension. There is no tenderness. Hernia confirmed negative in the right inguinal area and confirmed negative in the  left inguinal area.       Incisions clean with normal healing ridges.  No hernias  Musculoskeletal: Normal range of motion. He exhibits no tenderness.  Neurological: He is alert and oriented to person, place, and time. No cranial nerve deficit. He exhibits normal muscle tone. Coordination normal.  Skin: Skin is warm and dry. No rash noted. He is not diaphoretic.  Psychiatric: He has a normal mood and affect. His behavior is normal.       Assessment:     3 weeks s/p lap BIH repairs, recovering well    Plan:     Increase activity as tolerated.  Do not push through pain.  Advanced on diet as tolerated. Bowel regimen to avoid problems.  Return to clinic p.r.n. The patient expressed understanding and appreciation

## 2012-08-28 NOTE — Patient Instructions (Addendum)
HERNIA REPAIR: POST OP INSTRUCTIONS   1. ACTIVITIES as tolerated:   a. You may resume regular (light) daily activities beginning the next day-such as daily self-care, walking, climbing stairs-gradually increasing activities as tolerated.  If you can walk 30 minutes without difficulty, it is safe to try more intense activity such as jogging, treadmill, bicycling, low-impact aerobics, swimming, etc. b. Save the most intensive and strenuous activity for last such as sit-ups, heavy lifting, contact sports, etc  Refrain from any heavy lifting or straining until you are off narcotics for pain control.   c. DO NOT PUSH THROUGH PAIN.  Let pain be your guide: If it hurts to do something, don't do it.  Pain is your body warning you to avoid that activity for another week until the pain goes down. d. You may drive when you are no longer taking prescription pain medication, you can comfortably wear a seatbelt, and you can safely maneuver your car and apply brakes. e. Steven Carpenter may have sexual intercourse when it is comfortable.     The clinic staff is available to answer your questions during regular business hours (8:30am-5pm).  Please don't hesitate to call and ask to speak to one of our nurses for clinical concerns.   If you have a medical emergency, go to the nearest emergency room or call 911.  A surgeon from [facility] is always on call at the hospitals in [facility], [place] [address], [address], [place], [place]  [address] ?  [address] [address], [place], [place]   [address] MAIN: [phone] ? TOLL FREE: [phone] ? FAX: [phone] [email or web address]

## 2012-09-03 ENCOUNTER — Telehealth: Payer: Self-pay | Admitting: *Deleted

## 2012-09-03 DIAGNOSIS — Z0389 Encounter for observation for other suspected diseases and conditions ruled out: Secondary | ICD-10-CM

## 2012-09-03 DIAGNOSIS — Z0000 Encounter for general adult medical examination without abnormal findings: Secondary | ICD-10-CM

## 2012-09-03 NOTE — Telephone Encounter (Signed)
Message copied by Merrilyn Puma on Tue Sep 03, 2012  3:12 PM ------      Message from: Etheleen Sia      Created: Tue Aug 27, 2012 11:04 AM      Regarding: LABS       DEC 20 PHYSICAL

## 2012-09-03 NOTE — Telephone Encounter (Signed)
C[facility] labs entered.

## 2012-12-09 ENCOUNTER — Other Ambulatory Visit (INDEPENDENT_AMBULATORY_CARE_PROVIDER_SITE_OTHER): Payer: 59

## 2012-12-09 DIAGNOSIS — Z0000 Encounter for general adult medical examination without abnormal findings: Secondary | ICD-10-CM

## 2012-12-09 DIAGNOSIS — Z0389 Encounter for observation for other suspected diseases and conditions ruled out: Secondary | ICD-10-CM

## 2012-12-09 LAB — LIPID PANEL
Cholesterol: 242 mg/dL — ABNORMAL HIGH (ref 0–200)
HDL: 76.9 mg/dL (ref >39.00)
Total CHOL/HDL Ratio: 3
Triglycerides: 105 mg/dL (ref 0.0–149.0)
VLDL: 21 mg/dL (ref 0.0–40.0)

## 2012-12-09 LAB — URINALYSIS, ROUTINE W REFLEX MICROSCOPIC
Hgb urine dipstick: NEGATIVE
Nitrite: NEGATIVE
Total Protein, Urine: NEGATIVE
Urobilinogen, UA: 0.2 (ref 0.0–1.0)

## 2012-12-09 LAB — HEPATIC FUNCTION PANEL
Bilirubin, Direct: 0.1 mg/dL (ref 0.0–0.3)
Total Bilirubin: 1.1 mg/dL (ref 0.3–1.2)
Total Protein: 7.1 g/dL (ref 6.0–8.3)

## 2012-12-09 LAB — LDL CHOLESTEROL, DIRECT: Direct LDL: 149.2 mg/dL

## 2012-12-09 LAB — BASIC METABOLIC PANEL
CO2: 29 mEq/L (ref 19–32)
Calcium: 9.2 mg/dL (ref 8.4–10.5)
Creatinine, Ser: 0.8 mg/dL (ref 0.4–1.5)
GFR: 108.7 mL/min (ref >60.00)

## 2012-12-09 LAB — CBC WITH DIFFERENTIAL/PLATELET
Eosinophils Relative: 1.5 % (ref 0.0–5.0)
Lymphocytes Relative: 24.8 % (ref 12.0–46.0)
Monocytes Relative: 7.2 % (ref 3.0–12.0)
Neutrophils Relative %: 66.1 % (ref 43.0–77.0)
Platelets: 186 10*3/uL (ref 150.0–400.0)
RBC: 5.03 Mil/uL (ref 4.22–5.81)
WBC: 7.3 10*3/uL (ref 4.5–10.5)

## 2012-12-09 LAB — TSH: TSH: 2.76 u[IU]/mL (ref 0.35–5.50)

## 2012-12-13 ENCOUNTER — Encounter: Payer: Self-pay | Admitting: Internal Medicine

## 2012-12-13 ENCOUNTER — Ambulatory Visit (INDEPENDENT_AMBULATORY_CARE_PROVIDER_SITE_OTHER): Payer: 59 | Admitting: Internal Medicine

## 2012-12-13 VITALS — BP 122/98 | HR 80 | Temp 97.0°F | Resp 16 | Ht 68.0 in | Wt 186.0 lb

## 2012-12-13 DIAGNOSIS — I10 Essential (primary) hypertension: Secondary | ICD-10-CM

## 2012-12-13 DIAGNOSIS — K4020 Bilateral inguinal hernia, without obstruction or gangrene, not specified as recurrent: Secondary | ICD-10-CM

## 2012-12-13 DIAGNOSIS — Z0000 Encounter for general adult medical examination without abnormal findings: Secondary | ICD-10-CM

## 2012-12-13 DIAGNOSIS — L570 Actinic keratosis: Secondary | ICD-10-CM

## 2012-12-13 MED ORDER — AMLODIPINE-OLMESARTAN 5-40 MG PO TABS: 1.0000 | ORAL_TABLET | Freq: Every day | ORAL | Status: DC

## 2012-12-13 NOTE — Assessment & Plan Note (Signed)
Continue with current prescription therapy as reflected on the Med list.  

## 2012-12-13 NOTE — Assessment & Plan Note (Signed)
We discussed age appropriate health related issues, including available/recomended screening tests and vaccinations. We discussed a need for adhering to healthy diet and exercise. Labs/EKG were reviewed/ordered. All questions were answered.   

## 2012-12-13 NOTE — Assessment & Plan Note (Signed)
Doing well 

## 2012-12-13 NOTE — Patient Instructions (Addendum)
   Postprocedure instructions :     Keep the wounds clean. You can wash them with liquid soap and water. Pat dry with gauze or a Kleenex tissue  Before applying antibiotic ointment and a Band-Aid.   You need to report immediately  if  any signs of infection develop.    

## 2012-12-13 NOTE — Assessment & Plan Note (Signed)
See Cryo 

## 2012-12-13 NOTE — Progress Notes (Signed)
Subjective:    Patient ID: Steven Carpenter, male    DOB: 1956/12/18, 56 y.o.   MRN: [id number]  HPI The patient is here for a wellness exam. The patient has been doing well overall without major physical or psychological issues going on lately.  BP Readings from Last 3 Encounters:  12/13/12 122/98  08/28/12 136/88  08/27/12 148/98   Wt Readings from Last 3 Encounters:  12/13/12 186 lb (84.369 kg)  08/28/12 189 lb 3.2 oz (85.821 kg)  08/27/12 189 lb (85.73 kg)      Review of Systems  Constitutional: Negative for appetite change, fatigue and unexpected weight change.  HENT: Negative for nosebleeds, congestion, sore throat, sneezing, trouble swallowing and neck pain.   Eyes: Negative for itching and visual disturbance.  Respiratory: Negative for cough.   Cardiovascular: Negative for chest pain, palpitations and leg swelling.  Gastrointestinal: Negative for nausea, diarrhea, blood in stool and abdominal distention.  Genitourinary: Negative for frequency and hematuria.  Musculoskeletal: Negative for back pain, joint swelling and gait problem.  Skin: Negative for rash.  Neurological: Negative for dizziness, tremors, seizures, speech difficulty, weakness, light-headedness and numbness.  Psychiatric/Behavioral: Negative for suicidal ideas, sleep disturbance, dysphoric mood and agitation. The patient is not nervous/anxious.        Objective:   Physical Exam  Constitutional: He is oriented to person, place, and time. He appears well-developed and well-nourished. No distress.  HENT:  Head: Normocephalic and atraumatic.  Right Ear: External ear normal.  Left Ear: External ear normal.  Nose: Nose normal.  Mouth/Throat: Oropharynx is clear and moist. No oropharyngeal exudate.  Eyes: Conjunctivae normal and EOM are normal. Pupils are equal, round, and reactive to light. Right eye exhibits no discharge. Left eye exhibits no discharge. No scleral icterus.  Neck: Normal range of motion. Neck  supple. No JVD present. No tracheal deviation present. No thyromegaly present.  Cardiovascular: Normal rate, regular rhythm, normal heart sounds and intact distal pulses.  Exam reveals no gallop and no friction rub.   No murmur heard. Pulmonary/Chest: Effort normal and breath sounds normal. No stridor. No respiratory distress. He has no wheezes. He has no rales. He exhibits no tenderness.  Abdominal: Soft. Bowel sounds are normal. He exhibits no distension and no mass. There is no tenderness. There is no rebound and no guarding.  Genitourinary: Rectum normal, prostate normal and penis normal. Guaiac negative stool. No penile tenderness.  Musculoskeletal: Normal range of motion. He exhibits no edema and no tenderness.  Lymphadenopathy:    He has no cervical adenopathy.  Neurological: He is alert and oriented to person, place, and time. He has normal reflexes. No cranial nerve deficit. He exhibits normal muscle tone. Coordination normal.  Skin: Skin is warm and dry. No rash noted. He is not diaphoretic. No erythema. No pallor.       12/12 L cheek, R flank  Psychiatric: He has a normal mood and affect. His behavior is normal. Judgment and thought content normal.  AKs        Lab Results  Component Value Date   WBC 7.3 12/09/2012   HGB 15.1 12/09/2012   HCT 44.4 12/09/2012   PLT 186.0 12/09/2012   GLUCOSE 94 12/09/2012   CHOL 242* 12/09/2012   TRIG 105.0 12/09/2012   HDL 76.90 12/09/2012   LDLDIRECT 149.2 12/09/2012   ALT 29 12/09/2012   AST 21 12/09/2012   NA 138 12/09/2012   K 4.7 12/09/2012   CL 102 12/09/2012   CREATININE  0.8 12/09/2012   BUN 20 12/09/2012   CO2 29 12/09/2012   TSH 2.76 12/09/2012   PSA 1.31 12/09/2012    Procedure Note :     Procedure : Cryosurgery   Indication: Actinic keratosis(es)   Risks including unsuccessful procedure , bleeding, infection, bruising, scar, a need for a repeat  procedure and others were explained to the patient in detail as well as  the benefits. Informed consent was obtained verbally.    8 lesion(s)  on  Face and neck  was/were treated with liquid nitrogen on a Q-tip in a usual fasion . Band-Aid was applied and antibiotic ointment was given for a later use.   Tolerated well. Complications none.   Postprocedure instructions :     Keep the wounds clean. You can wash them with liquid soap and water. Pat dry with gauze or a Kleenex tissue  Before applying antibiotic ointment and a Band-Aid.   You need to report immediately  if  any signs of infection develop.    Assessment & Plan:

## 2012-12-17 ENCOUNTER — Telehealth: Payer: Self-pay | Admitting: Internal Medicine

## 2012-12-17 NOTE — Telephone Encounter (Signed)
Ok both Thx 

## 2012-12-17 NOTE — Telephone Encounter (Signed)
Pt wants the Azor sent to [facility] due to the cost.  He also needs a 7 day supply sent to [facility] in [place].

## 2012-12-19 MED ORDER — AMLODIPINE-OLMESARTAN 5-40 MG PO TABS: 1.0000 | ORAL_TABLET | Freq: Every day | ORAL | Status: DC

## 2012-12-19 NOTE — Telephone Encounter (Signed)
Done. Pt informed.

## 2013-01-07 ENCOUNTER — Telehealth: Payer: Self-pay | Admitting: *Deleted

## 2013-01-07 DIAGNOSIS — Z0000 Encounter for general adult medical examination without abnormal findings: Secondary | ICD-10-CM

## 2013-01-07 DIAGNOSIS — Z0389 Encounter for observation for other suspected diseases and conditions ruled out: Secondary | ICD-10-CM

## 2013-01-07 NOTE — Telephone Encounter (Signed)
Message copied by Merrilyn Puma on Tue Jan 07, 2013 11:44 AM ------      Message from: Etheleen Sia      Created: Fri Dec 13, 2012 10:42 AM      Regarding: LABS       CPE LABS FOR DEC 2014

## 2013-01-07 NOTE — Telephone Encounter (Signed)
Labs entered.

## 2013-11-19 ENCOUNTER — Other Ambulatory Visit: Payer: Self-pay | Admitting: Internal Medicine

## 2013-12-15 ENCOUNTER — Other Ambulatory Visit (INDEPENDENT_AMBULATORY_CARE_PROVIDER_SITE_OTHER): Payer: 59

## 2013-12-15 ENCOUNTER — Ambulatory Visit (INDEPENDENT_AMBULATORY_CARE_PROVIDER_SITE_OTHER): Payer: 59 | Admitting: Internal Medicine

## 2013-12-15 ENCOUNTER — Encounter: Payer: Self-pay | Admitting: Internal Medicine

## 2013-12-15 VITALS — BP 130/85 | HR 72 | Temp 97.5°F | Resp 16 | Ht 68.0 in | Wt 193.0 lb

## 2013-12-15 DIAGNOSIS — Z0389 Encounter for observation for other suspected diseases and conditions ruled out: Secondary | ICD-10-CM

## 2013-12-15 DIAGNOSIS — Z0000 Encounter for general adult medical examination without abnormal findings: Secondary | ICD-10-CM

## 2013-12-15 DIAGNOSIS — Z23 Encounter for immunization: Secondary | ICD-10-CM

## 2013-12-15 LAB — CBC WITH DIFFERENTIAL/PLATELET
Basophils Relative: 0.3 % (ref 0.0–3.0)
Eosinophils Absolute: 0.1 10*3/uL (ref 0.0–0.7)
Lymphocytes Relative: 28.8 % (ref 12.0–46.0)
MCHC: 33.5 g/dL (ref 30.0–36.0)
Neutrophils Relative %: 62.4 % (ref 43.0–77.0)
RBC: 5.04 Mil/uL (ref 4.22–5.81)
WBC: 6.8 10*3/uL (ref 4.5–10.5)

## 2013-12-15 LAB — URINALYSIS, ROUTINE W REFLEX MICROSCOPIC
Bilirubin Urine: NEGATIVE
Hgb urine dipstick: NEGATIVE
Nitrite: NEGATIVE
Specific Gravity, Urine: 1.025 (ref 1.000–1.030)
Urine Glucose: NEGATIVE
Urobilinogen, UA: 0.2 (ref 0.0–1.0)

## 2013-12-15 LAB — HEPATIC FUNCTION PANEL
Albumin: 4.5 g/dL (ref 3.5–5.2)
Alkaline Phosphatase: 69 U/L (ref 39–117)

## 2013-12-15 LAB — LDL CHOLESTEROL, DIRECT: Direct LDL: 176.9 mg/dL

## 2013-12-15 LAB — BASIC METABOLIC PANEL
BUN: 24 mg/dL — ABNORMAL HIGH (ref 6–23)
Calcium: 9.1 mg/dL (ref 8.4–10.5)
GFR: 113.33 mL/min (ref >60.00)
Glucose, Bld: 90 mg/dL (ref 70–99)
Sodium: 138 mEq/L (ref 135–145)

## 2013-12-15 LAB — LIPID PANEL
HDL: 68.6 mg/dL (ref >39.00)
Triglycerides: 80 mg/dL (ref 0.0–149.0)

## 2013-12-15 LAB — TSH: TSH: 1.89 u[IU]/mL (ref 0.35–5.50)

## 2013-12-15 LAB — PSA: PSA: 1.87 ng/mL (ref 0.10–4.00)

## 2013-12-15 NOTE — Progress Notes (Signed)
Patient ID: Steven Carpenter, male   DOB: 24-Dec-1956, 57 y.o.   MRN: [id number]   Subjective:    Patient ID: Steven Carpenter, male    DOB: Dec 21, 1956, 57 y.o.   MRN: [id number]  HPI The patient is here for a wellness exam.   The patient has been doing well overall without major physical or psychological issues going on lately.  BP Readings from Last 3 Encounters:  12/15/13 152/118  12/13/12 122/98  08/28/12 136/88   Wt Readings from Last 3 Encounters:  12/15/13 193 lb (87.544 kg)  12/13/12 186 lb (84.369 kg)  08/28/12 189 lb 3.2 oz (85.821 kg)      Review of Systems  Constitutional: Negative for appetite change, fatigue and unexpected weight change.  HENT: Negative for congestion, nosebleeds, sneezing, sore throat and trouble swallowing.   Eyes: Negative for itching and visual disturbance.  Respiratory: Negative for cough.   Cardiovascular: Negative for chest pain, palpitations and leg swelling.  Gastrointestinal: Negative for nausea, diarrhea, blood in stool and abdominal distention.  Genitourinary: Negative for frequency and hematuria.  Musculoskeletal: Negative for back pain, gait problem, joint swelling and neck pain.  Skin: Negative for rash.  Neurological: Negative for dizziness, tremors, seizures, speech difficulty, weakness, light-headedness and numbness.  Psychiatric/Behavioral: Negative for suicidal ideas, sleep disturbance, dysphoric mood and agitation. The patient is not nervous/anxious.        Objective:   Physical Exam  Constitutional: He is oriented to person, place, and time. He appears well-developed and well-nourished. No distress.  HENT:  Head: Normocephalic and atraumatic.  Right Ear: External ear normal.  Left Ear: External ear normal.  Nose: Nose normal.  Mouth/Throat: Oropharynx is clear and moist. No oropharyngeal exudate.  Eyes: Conjunctivae and EOM are normal. Pupils are equal, round, and reactive to light. Right eye exhibits no discharge. Left eye  exhibits no discharge. No scleral icterus.  Neck: Normal range of motion. Neck supple. No JVD present. No tracheal deviation present. No thyromegaly present.  Cardiovascular: Normal rate, regular rhythm, normal heart sounds and intact distal pulses.  Exam reveals no gallop and no friction rub.   No murmur heard. Pulmonary/Chest: Effort normal and breath sounds normal. No stridor. No respiratory distress. He has no wheezes. He has no rales. He exhibits no tenderness.  Abdominal: Soft. Bowel sounds are normal. He exhibits no distension and no mass. There is no tenderness. There is no rebound and no guarding.  Genitourinary: Rectum normal, prostate normal and penis normal. Guaiac negative stool. No penile tenderness.  Musculoskeletal: Normal range of motion. He exhibits no edema and no tenderness.  Lymphadenopathy:    He has no cervical adenopathy.  Neurological: He is alert and oriented to person, place, and time. He has normal reflexes. No cranial nerve deficit. He exhibits normal muscle tone. Coordination normal.  Skin: Skin is warm and dry. No rash noted. He is not diaphoretic. No erythema. No pallor.  12/12 L cheek, R flank  Psychiatric: He has a normal mood and affect. His behavior is normal. Judgment and thought content normal.  AKs      Lab Results  Component Value Date   WBC 7.3 12/09/2012   HGB 15.1 12/09/2012   HCT 44.4 12/09/2012   PLT 186.0 12/09/2012   GLUCOSE 94 12/09/2012   CHOL 242* 12/09/2012   TRIG 105.0 12/09/2012   HDL 76.90 12/09/2012   LDLDIRECT 149.2 12/09/2012   ALT 29 12/09/2012   AST 21 12/09/2012   NA 138 12/09/2012   K  4.7 12/09/2012   CL 102 12/09/2012   CREATININE 0.8 12/09/2012   BUN 20 12/09/2012   CO2 29 12/09/2012   TSH 2.76 12/09/2012   PSA 1.31 12/09/2012      Assessment & Plan:

## 2013-12-15 NOTE — Progress Notes (Signed)
Pre visit review using our clinic review tool, if applicable. No additional management support is needed unless otherwise documented below in the visit note. 

## 2013-12-15 NOTE — Assessment & Plan Note (Signed)
We discussed age appropriate health related issues, including available/recomended screening tests and vaccinations. We discussed a need for adhering to healthy diet and exercise. Labs/EKG were reviewed/ordered. All questions were answered.   

## 2014-07-24 ENCOUNTER — Other Ambulatory Visit: Payer: Self-pay | Admitting: Internal Medicine

## 2014-12-22 ENCOUNTER — Ambulatory Visit (INDEPENDENT_AMBULATORY_CARE_PROVIDER_SITE_OTHER): Payer: 59 | Admitting: Internal Medicine

## 2014-12-22 ENCOUNTER — Encounter: Payer: Self-pay | Admitting: Internal Medicine

## 2014-12-22 ENCOUNTER — Other Ambulatory Visit (INDEPENDENT_AMBULATORY_CARE_PROVIDER_SITE_OTHER): Payer: 59

## 2014-12-22 VITALS — BP 120/85 | HR 63 | Temp 98.2°F | Ht 68.0 in | Wt 187.0 lb

## 2014-12-22 DIAGNOSIS — Z0000 Encounter for general adult medical examination without abnormal findings: Secondary | ICD-10-CM

## 2014-12-22 DIAGNOSIS — L570 Actinic keratosis: Secondary | ICD-10-CM

## 2014-12-22 DIAGNOSIS — I10 Essential (primary) hypertension: Secondary | ICD-10-CM

## 2014-12-22 DIAGNOSIS — F4321 Adjustment disorder with depressed mood: Secondary | ICD-10-CM | POA: Insufficient documentation

## 2014-12-22 DIAGNOSIS — F439 Reaction to severe stress, unspecified: Secondary | ICD-10-CM

## 2014-12-22 DIAGNOSIS — Z1211 Encounter for screening for malignant neoplasm of colon: Secondary | ICD-10-CM

## 2014-12-22 DIAGNOSIS — Z638 Other specified problems related to primary support group: Secondary | ICD-10-CM

## 2014-12-22 LAB — HEPATIC FUNCTION PANEL
ALK PHOS: 69 U/L (ref 39–117)
ALT: 50 U/L (ref 0–53)
AST: 27 U/L (ref 0–37)
Albumin: 4.3 g/dL (ref 3.5–5.2)
BILIRUBIN TOTAL: 0.8 mg/dL (ref 0.2–1.2)
Bilirubin, Direct: 0.1 mg/dL (ref 0.0–0.3)
Total Protein: 7.1 g/dL (ref 6.0–8.3)

## 2014-12-22 LAB — URINALYSIS
Bilirubin Urine: NEGATIVE
Hgb urine dipstick: NEGATIVE
KETONES UR: NEGATIVE
Leukocytes, UA: NEGATIVE
Nitrite: NEGATIVE
SPECIFIC GRAVITY, URINE: 1.015 (ref 1.000–1.030)
TOTAL PROTEIN, URINE-UPE24: NEGATIVE
URINE GLUCOSE: NEGATIVE
UROBILINOGEN UA: 0.2 (ref 0.0–1.0)
pH: 7 (ref 5.0–8.0)

## 2014-12-22 LAB — LIPID PANEL
CHOL/HDL RATIO: 3
Cholesterol: 263 mg/dL — ABNORMAL HIGH (ref 0–200)
HDL: 77.3 mg/dL (ref >39.00)
LDL Cholesterol: 173 mg/dL — ABNORMAL HIGH (ref 0–99)
NonHDL: 185.7
Triglycerides: 63 mg/dL (ref 0.0–149.0)
VLDL: 12.6 mg/dL (ref 0.0–40.0)

## 2014-12-22 LAB — PSA: PSA: 1.39 ng/mL (ref 0.10–4.00)

## 2014-12-22 LAB — BASIC METABOLIC PANEL
BUN: 19 mg/dL (ref 6–23)
CALCIUM: 9.2 mg/dL (ref 8.4–10.5)
CO2: 29 mEq/L (ref 19–32)
Chloride: 104 mEq/L (ref 96–112)
Creatinine, Ser: 0.8 mg/dL (ref 0.4–1.5)
GFR: 104.83 mL/min (ref >60.00)
GLUCOSE: 89 mg/dL (ref 70–99)
POTASSIUM: 4.5 meq/L (ref 3.5–5.1)
SODIUM: 139 meq/L (ref 135–145)

## 2014-12-22 LAB — CBC WITH DIFFERENTIAL/PLATELET
BASOS ABS: 0 10*3/uL (ref 0.0–0.1)
Basophils Relative: 0.6 % (ref 0.0–3.0)
Eosinophils Absolute: 0.2 10*3/uL (ref 0.0–0.7)
Eosinophils Relative: 2.8 % (ref 0.0–5.0)
HEMATOCRIT: 43.8 % (ref 39.0–52.0)
Hemoglobin: 14.6 g/dL (ref 13.0–17.0)
LYMPHS ABS: 2.5 10*3/uL (ref 0.7–4.0)
Lymphocytes Relative: 34 % (ref 12.0–46.0)
MCHC: 33.3 g/dL (ref 30.0–36.0)
MCV: 87.8 fl (ref 78.0–100.0)
MONO ABS: 0.5 10*3/uL (ref 0.1–1.0)
Monocytes Relative: 7.3 % (ref 3.0–12.0)
Neutro Abs: 4 10*3/uL (ref 1.4–7.7)
Neutrophils Relative %: 55.3 % (ref 43.0–77.0)
PLATELETS: 199 10*3/uL (ref 150.0–400.0)
RBC: 4.99 Mil/uL (ref 4.22–5.81)
RDW: 14.1 % (ref 11.5–15.5)
WBC: 7.3 10*3/uL (ref 4.0–10.5)

## 2014-12-22 LAB — TSH: TSH: 1.74 u[IU]/mL (ref 0.35–4.50)

## 2014-12-22 NOTE — Assessment & Plan Note (Signed)
12/15 wife's illness Wife is sick w/anemia, head/scalp tumor (2015) Coping well w/stress

## 2014-12-22 NOTE — Assessment & Plan Note (Signed)
Nl BP Continue with current prescription therapy as reflected on the Med list. Monitor BP at home prn

## 2014-12-22 NOTE — Progress Notes (Signed)
Pre visit review using our clinic review tool, if applicable. No additional management support is needed unless otherwise documented below in the visit note. 

## 2014-12-22 NOTE — Assessment & Plan Note (Addendum)
We discussed age appropriate health related issues, including available/recomended screening tests and vaccinations. We discussed a need for adhering to healthy diet and exercise. Labs/EKG were reviewed/ordered. All questions were answered. Colonoscopy   

## 2014-12-22 NOTE — Assessment & Plan Note (Signed)
Derm ref adviced

## 2014-12-22 NOTE — Progress Notes (Signed)
Subjective:    HPI   The patient is here for a wellness exam. Wife is sick w/anemia, head/scalp tumor (2015)  The patient has been doing well overall without major physical or psychological issues going on lately.  BP Readings from Last 3 Encounters:  12/22/14 120/85  12/15/13 130/85  12/13/12 122/98   Wt Readings from Last 3 Encounters:  12/22/14 187 lb (84.823 kg)  12/15/13 193 lb (87.544 kg)  12/13/12 186 lb (84.369 kg)      Review of Systems  Constitutional: Negative for appetite change, fatigue and unexpected weight change.  HENT: Negative for congestion, nosebleeds, sneezing, sore throat and trouble swallowing.   Eyes: Negative for itching and visual disturbance.  Respiratory: Negative for cough.   Cardiovascular: Negative for chest pain, palpitations and leg swelling.  Gastrointestinal: Negative for nausea, diarrhea, blood in stool and abdominal distention.  Genitourinary: Negative for frequency and hematuria.  Musculoskeletal: Negative for back pain, joint swelling, gait problem and neck pain.  Skin: Negative for rash.  Neurological: Negative for dizziness, tremors, seizures, speech difficulty, weakness, light-headedness and numbness.  Psychiatric/Behavioral: Negative for suicidal ideas, sleep disturbance, dysphoric mood and agitation. The patient is not nervous/anxious.        Objective:   Physical Exam  Constitutional: He is oriented to person, place, and time. He appears well-developed and well-nourished. No distress.  HENT:  Head: Normocephalic and atraumatic.  Right Ear: External ear normal.  Left Ear: External ear normal.  Nose: Nose normal.  Mouth/Throat: Oropharynx is clear and moist. No oropharyngeal exudate.  Eyes: Conjunctivae and EOM are normal. Pupils are equal, round, and reactive to light. Right eye exhibits no discharge. Left eye exhibits no discharge. No scleral icterus.  Neck: Normal range of motion. Neck supple. No JVD present. No tracheal  deviation present. No thyromegaly present.  Cardiovascular: Normal rate, regular rhythm, normal heart sounds and intact distal pulses.  Exam reveals no gallop and no friction rub.   No murmur heard. Pulmonary/Chest: Effort normal and breath sounds normal. No stridor. No respiratory distress. He has no wheezes. He has no rales. He exhibits no tenderness.  Abdominal: Soft. Bowel sounds are normal. He exhibits no distension and no mass. There is no tenderness. There is no rebound and no guarding.  Genitourinary: Rectum normal and penis normal. Guaiac negative stool. No penile tenderness.  1+ prostate  Musculoskeletal: Normal range of motion. He exhibits no edema or tenderness.  Lymphadenopathy:    He has no cervical adenopathy.  Neurological: He is alert and oriented to person, place, and time. He has normal reflexes. No cranial nerve deficit. He exhibits normal muscle tone. Coordination normal.  Skin: Skin is warm and dry. No rash noted. He is not diaphoretic. No erythema. No pallor.  Psychiatric: He has a normal mood and affect. His behavior is normal. Judgment and thought content normal.  AKs      Lab Results  Component Value Date   WBC 6.8 12/15/2013   HGB 14.7 12/15/2013   HCT 43.8 12/15/2013   PLT 191.0 12/15/2013   GLUCOSE 90 12/15/2013   CHOL 253* 12/15/2013   TRIG 80.0 12/15/2013   HDL 68.60 12/15/2013   LDLDIRECT 176.9 12/15/2013   ALT 35 12/15/2013   AST 22 12/15/2013   NA 138 12/15/2013   K 4.1 12/15/2013   CL 104 12/15/2013   CREATININE 0.8 12/15/2013   BUN 24* 12/15/2013   CO2 27 12/15/2013   TSH 1.89 12/15/2013   PSA 1.87 12/15/2013  Assessment & Plan:

## 2015-03-28 ENCOUNTER — Other Ambulatory Visit: Payer: Self-pay | Admitting: Internal Medicine

## 2015-12-10 ENCOUNTER — Telehealth: Payer: Self-pay | Admitting: Internal Medicine

## 2015-12-10 DIAGNOSIS — Z0000 Encounter for general adult medical examination without abnormal findings: Secondary | ICD-10-CM

## 2015-12-10 NOTE — Telephone Encounter (Signed)
Patient is requesting to get CPE labs done prior to visit on 12/16/2015. Please enter lab orders and call patient to notify entered

## 2015-12-10 NOTE — Telephone Encounter (Signed)
Labs are entered. Pt informed

## 2015-12-14 ENCOUNTER — Other Ambulatory Visit (INDEPENDENT_AMBULATORY_CARE_PROVIDER_SITE_OTHER): Payer: 59

## 2015-12-14 DIAGNOSIS — Z0000 Encounter for general adult medical examination without abnormal findings: Secondary | ICD-10-CM

## 2015-12-14 LAB — URINALYSIS, ROUTINE W REFLEX MICROSCOPIC
BILIRUBIN URINE: NEGATIVE
Hgb urine dipstick: NEGATIVE
KETONES UR: NEGATIVE
Leukocytes, UA: NEGATIVE
Nitrite: NEGATIVE
PH: 7.5 (ref 5.0–8.0)
RBC / HPF: NONE SEEN (ref 0–?)
Specific Gravity, Urine: 1.015 (ref 1.000–1.030)
TOTAL PROTEIN, URINE-UPE24: NEGATIVE
UROBILINOGEN UA: 0.2 (ref 0.0–1.0)
Urine Glucose: NEGATIVE
WBC, UA: NONE SEEN (ref 0–?)

## 2015-12-14 LAB — CBC WITH DIFFERENTIAL/PLATELET
BASOS ABS: 0 10*3/uL (ref 0.0–0.1)
Basophils Relative: 0.5 % (ref 0.0–3.0)
Eosinophils Absolute: 0.2 10*3/uL (ref 0.0–0.7)
Eosinophils Relative: 2.7 % (ref 0.0–5.0)
HEMATOCRIT: 45.9 % (ref 39.0–52.0)
Hemoglobin: 15.3 g/dL (ref 13.0–17.0)
LYMPHS PCT: 22.4 % (ref 12.0–46.0)
Lymphs Abs: 1.5 10*3/uL (ref 0.7–4.0)
MCHC: 33.2 g/dL (ref 30.0–36.0)
MCV: 88.7 fl (ref 78.0–100.0)
MONOS PCT: 6.8 % (ref 3.0–12.0)
Monocytes Absolute: 0.5 10*3/uL (ref 0.1–1.0)
NEUTROS ABS: 4.6 10*3/uL (ref 1.4–7.7)
Neutrophils Relative %: 67.6 % (ref 43.0–77.0)
PLATELETS: 205 10*3/uL (ref 150.0–400.0)
RBC: 5.18 Mil/uL (ref 4.22–5.81)
RDW: 13.9 % (ref 11.5–15.5)
WBC: 6.8 10*3/uL (ref 4.0–10.5)

## 2015-12-14 LAB — LIPID PANEL
CHOL/HDL RATIO: 3
Cholesterol: 252 mg/dL — ABNORMAL HIGH (ref 0–200)
HDL: 78 mg/dL (ref >39.00)
LDL Cholesterol: 154 mg/dL — ABNORMAL HIGH (ref 0–99)
NONHDL: 174.17
Triglycerides: 101 mg/dL (ref 0.0–149.0)
VLDL: 20.2 mg/dL (ref 0.0–40.0)

## 2015-12-14 LAB — HEPATIC FUNCTION PANEL
ALBUMIN: 4.5 g/dL (ref 3.5–5.2)
ALT: 39 U/L (ref 0–53)
AST: 22 U/L (ref 0–37)
Alkaline Phosphatase: 76 U/L (ref 39–117)
Bilirubin, Direct: 0.2 mg/dL (ref 0.0–0.3)
TOTAL PROTEIN: 6.9 g/dL (ref 6.0–8.3)
Total Bilirubin: 1 mg/dL (ref 0.2–1.2)

## 2015-12-14 LAB — BASIC METABOLIC PANEL
BUN: 18 mg/dL (ref 6–23)
CALCIUM: 9.5 mg/dL (ref 8.4–10.5)
CO2: 30 meq/L (ref 19–32)
CREATININE: 0.8 mg/dL (ref 0.40–1.50)
Chloride: 103 mEq/L (ref 96–112)
GFR: 104.49 mL/min (ref >60.00)
GLUCOSE: 96 mg/dL (ref 70–99)
Potassium: 4.6 mEq/L (ref 3.5–5.1)
Sodium: 140 mEq/L (ref 135–145)

## 2015-12-14 LAB — TSH: TSH: 2.19 u[IU]/mL (ref 0.35–4.50)

## 2015-12-14 LAB — PSA: PSA: 1.71 ng/mL (ref 0.10–4.00)

## 2015-12-16 ENCOUNTER — Encounter: Payer: Self-pay | Admitting: Internal Medicine

## 2015-12-16 ENCOUNTER — Ambulatory Visit (INDEPENDENT_AMBULATORY_CARE_PROVIDER_SITE_OTHER): Payer: 59 | Admitting: Internal Medicine

## 2015-12-16 VITALS — BP 118/80 | HR 79 | Ht 68.0 in | Wt 192.0 lb

## 2015-12-16 DIAGNOSIS — Z1211 Encounter for screening for malignant neoplasm of colon: Secondary | ICD-10-CM

## 2015-12-16 DIAGNOSIS — E785 Hyperlipidemia, unspecified: Secondary | ICD-10-CM

## 2015-12-16 DIAGNOSIS — Z0000 Encounter for general adult medical examination without abnormal findings: Secondary | ICD-10-CM

## 2015-12-16 DIAGNOSIS — L850 Acquired ichthyosis: Secondary | ICD-10-CM

## 2015-12-16 DIAGNOSIS — I10 Essential (primary) hypertension: Secondary | ICD-10-CM

## 2015-12-16 DIAGNOSIS — F439 Reaction to severe stress, unspecified: Secondary | ICD-10-CM

## 2015-12-16 DIAGNOSIS — L853 Xerosis cutis: Secondary | ICD-10-CM | POA: Insufficient documentation

## 2015-12-16 MED ORDER — ATORVASTATIN CALCIUM 10 MG PO TABS
10.0000 mg | ORAL_TABLET | Freq: Every day | ORAL | Status: DC
Start: 2015-12-16 — End: 2015-12-16

## 2015-12-16 MED ORDER — TRIAMCINOLONE ACETONIDE 0.5 % EX OINT: 1.0000 "application " | TOPICAL_OINTMENT | Freq: Two times a day (BID) | CUTANEOUS | Status: DC

## 2015-12-16 MED ORDER — ATORVASTATIN CALCIUM 10 MG PO TABS: 10.0000 mg | ORAL_TABLET | Freq: Every day | ORAL | Status: DC

## 2015-12-16 MED ORDER — AMLODIPINE-OLMESARTAN 5-40 MG PO TABS: 1.0000 | ORAL_TABLET | Freq: Every day | ORAL | Status: DC

## 2015-12-16 MED ORDER — ATORVASTATIN CALCIUM 10 MG PO TABS
10.0000 mg | ORAL_TABLET | Freq: Every day | ORAL | Status: DC
Start: 2015-12-16 — End: 2016-11-22

## 2015-12-16 NOTE — Assessment & Plan Note (Signed)
We discussed age appropriate health related issues, including available/recomended screening tests and vaccinations. We discussed a need for adhering to healthy diet and exercise. Labs/EKG were reviewed/ordered. All questions were answered.   

## 2015-12-16 NOTE — Progress Notes (Signed)
Subjective:  Patient ID: Steven Carpenter, male    DOB: [birth year]  Age: 59 y.o. MRN: [id number]  CC: Annual Exam   HPI [name] presents for a well exam. Wife died in 26-Aug-2015 - brain tumor  Outpatient Prescriptions Prior to Visit  Medication Sig Dispense Refill  . aspirin 81 MG tablet Take 81 mg by mouth daily.      . beta carotene w/minerals (OCUVITE) tablet Take 1 tablet by mouth daily.    . Cholecalciferol 1000 UNITS tablet Take 1,000 Units by mouth daily.      . fish oil-omega-3 fatty acids 1000 MG capsule Take 2 g by mouth daily.    . Multiple Vitamin (MULTIVITAMIN) tablet Take 1 tablet by mouth daily.    [name] amLODipine-olmesartan (AZOR) 5-40 MG per tablet Take 1 tablet by mouth daily. 90 tablet 1   No facility-administered medications prior to visit.    ROS Review of Systems  Constitutional: Negative for appetite change, fatigue and unexpected weight change.  HENT: Negative for congestion, nosebleeds, sneezing, sore throat and trouble swallowing.   Eyes: Negative for itching and visual disturbance.  Respiratory: Negative for cough.   Cardiovascular: Negative for chest pain, palpitations and leg swelling.  Gastrointestinal: Negative for nausea, diarrhea, blood in stool and abdominal distention.  Genitourinary: Negative for frequency and hematuria.  Musculoskeletal: Negative for back pain, joint swelling, gait problem and neck pain.  Skin: Negative for rash.  Neurological: Negative for dizziness, tremors, speech difficulty and weakness.  Psychiatric/Behavioral: Negative for sleep disturbance, dysphoric mood and agitation. The patient is not nervous/anxious.     Objective:  BP 118/80 mmHg  Pulse 79  Ht 5\' 8"  (1.727 m)  Wt 192 lb (87.091 kg)  BMI 29.20 kg/m2  SpO2 96%  BP Readings from Last 3 Encounters:  12/16/15 118/80  12/22/14 120/85  12/15/13 130/85    Wt Readings from Last 3 Encounters:  12/16/15 192 lb (87.091 kg)  12/22/14 187 lb (84.823 kg)  12/15/13  193 lb (87.544 kg)    Physical Exam  Constitutional: He is oriented to person, place, and time. He appears well-developed and well-nourished. No distress.  HENT:  Head: Normocephalic and atraumatic.  Right Ear: External ear normal.  Left Ear: External ear normal.  Nose: Nose normal.  Mouth/Throat: Oropharynx is clear and moist. No oropharyngeal exudate.  Eyes: Conjunctivae and EOM are normal. Pupils are equal, round, and reactive to light. Right eye exhibits no discharge. Left eye exhibits no discharge. No scleral icterus.  Neck: Normal range of motion. Neck supple. No JVD present. No tracheal deviation present. No thyromegaly present.  Cardiovascular: Normal rate, regular rhythm, normal heart sounds and intact distal pulses.  Exam reveals no gallop and no friction rub.   No murmur heard. Pulmonary/Chest: Effort normal and breath sounds normal. No stridor. No respiratory distress. He has no wheezes. He has no rales. He exhibits no tenderness.  Abdominal: Soft. Bowel sounds are normal. He exhibits no distension and no mass. There is no tenderness. There is no rebound and no guarding.  Genitourinary: Rectum normal and penis normal. Guaiac negative stool. No penile tenderness.  Musculoskeletal: Normal range of motion. He exhibits no edema or tenderness.  Lymphadenopathy:    He has no cervical adenopathy.  Neurological: He is alert and oriented to person, place, and time. He has normal reflexes. No cranial nerve deficit. He exhibits normal muscle tone. Coordination normal.  Skin: Skin is warm and dry. No rash noted. He is not diaphoretic.  No erythema. No pallor.  Psychiatric: He has a normal mood and affect. His behavior is normal. Judgment and thought content normal.  Dry skin Prostate 1+ EKG NSR  Lab Results  Component Value Date   WBC 6.8 12/14/2015   HGB 15.3 12/14/2015   HCT 45.9 12/14/2015   PLT 205.0 12/14/2015   GLUCOSE 96 12/14/2015   CHOL 252* 12/14/2015   TRIG 101.0  12/14/2015   HDL 78.00 12/14/2015   LDLDIRECT 176.9 12/15/2013   LDLCALC 154* 12/14/2015   ALT 39 12/14/2015   AST 22 12/14/2015   NA 140 12/14/2015   K 4.6 12/14/2015   CL 103 12/14/2015   CREATININE 0.80 12/14/2015   BUN 18 12/14/2015   CO2 30 12/14/2015   TSH 2.19 12/14/2015   PSA 1.71 12/14/2015    No results found.  Assessment & Plan:   Brayton was seen today for annual exam.  Diagnoses and all orders for this visit:  Well adult exam -     EKG 12-Lead -     Hepatitis C antibody; Future  Dyslipidemia -     Hepatic function panel; Future -     Lipid panel; Future  Essential hypertension  Stress at home  Colon cancer screening -     Ambulatory referral to Gastroenterology  Other orders -     Cancel: amLODipine-olmesartan (AZOR) 5-40 MG tablet; Take 1 tablet by mouth daily. -     amLODipine-olmesartan (AZOR) 5-40 MG tablet; Take 1 tablet by mouth daily. -     Discontinue: atorvastatin (LIPITOR) 10 MG tablet; Take 1 tablet (10 mg total) by mouth daily. -     triamcinolone ointment (KENALOG) 0.5 %; Apply 1 application topically 2 (two) times daily. -     Discontinue: atorvastatin (LIPITOR) 10 MG tablet; Take 1 tablet (10 mg total) by mouth daily. -     atorvastatin (LIPITOR) 10 MG tablet; Take 1 tablet (10 mg total) by mouth daily.   I have changed Mr. Frew's amLODipine-olmesartan. I am also having him start on triamcinolone ointment. Additionally, I am having him maintain his aspirin, Cholecalciferol, fish oil-omega-3 fatty acids, multivitamin, beta carotene w/minerals, and atorvastatin.  Meds ordered this encounter  Medications  . amLODipine-olmesartan (AZOR) 5-40 MG tablet    Sig: Take 1 tablet by mouth daily.    Dispense:  90 tablet    Refill:  3  . DISCONTD: atorvastatin (LIPITOR) 10 MG tablet    Sig: Take 1 tablet (10 mg total) by mouth daily.    Dispense:  30 tablet    Refill:  11  . triamcinolone ointment (KENALOG) 0.5 %    Sig: Apply 1 application  topically 2 (two) times daily.    Dispense:  30 g    Refill:  2  . DISCONTD: atorvastatin (LIPITOR) 10 MG tablet    Sig: Take 1 tablet (10 mg total) by mouth daily.    Dispense:  90 tablet    Refill:  3  . atorvastatin (LIPITOR) 10 MG tablet    Sig: Take 1 tablet (10 mg total) by mouth daily.    Dispense:  90 tablet    Refill:  3     Follow-up: Return in about 1 year (around 12/15/2016) for Wellness Exam.  Walker Kehr, MD

## 2015-12-16 NOTE — Progress Notes (Signed)
Pre visit review using our clinic review tool, if applicable. No additional management support is needed unless otherwise documented below in the visit note. 

## 2015-12-16 NOTE — Assessment & Plan Note (Signed)
BP is nl at home Azor 

## 2015-12-16 NOTE — Assessment & Plan Note (Signed)
Shower less long Triamc Rx prn

## 2015-12-24 ENCOUNTER — Encounter: Payer: 59 | Admitting: Internal Medicine

## 2016-04-21 ENCOUNTER — Other Ambulatory Visit (INDEPENDENT_AMBULATORY_CARE_PROVIDER_SITE_OTHER): Payer: 59

## 2016-04-21 DIAGNOSIS — Z0000 Encounter for general adult medical examination without abnormal findings: Secondary | ICD-10-CM

## 2016-04-21 DIAGNOSIS — E785 Hyperlipidemia, unspecified: Secondary | ICD-10-CM | POA: Diagnosis not present

## 2016-04-21 LAB — LIPID PANEL
CHOL/HDL RATIO: 2
Cholesterol: 142 mg/dL (ref 0–200)
HDL: 58.4 mg/dL (ref >39.00)
LDL Cholesterol: 73 mg/dL (ref 0–99)
NONHDL: 83.96
Triglycerides: 54 mg/dL (ref 0.0–149.0)
VLDL: 10.8 mg/dL (ref 0.0–40.0)

## 2016-04-21 LAB — HEPATIC FUNCTION PANEL
ALK PHOS: 67 U/L (ref 39–117)
ALT: 18 U/L (ref 0–53)
AST: 14 U/L (ref 0–37)
Albumin: 4.4 g/dL (ref 3.5–5.2)
BILIRUBIN DIRECT: 0.2 mg/dL (ref 0.0–0.3)
BILIRUBIN TOTAL: 0.8 mg/dL (ref 0.2–1.2)
Total Protein: 6.8 g/dL (ref 6.0–8.3)

## 2016-04-22 LAB — HEPATITIS C ANTIBODY: HCV AB: NEGATIVE

## 2016-07-06 ENCOUNTER — Telehealth: Payer: Self-pay | Admitting: Internal Medicine

## 2016-07-06 DIAGNOSIS — Z125 Encounter for screening for malignant neoplasm of prostate: Secondary | ICD-10-CM

## 2016-07-06 DIAGNOSIS — I10 Essential (primary) hypertension: Secondary | ICD-10-CM

## 2016-07-06 DIAGNOSIS — Z0000 Encounter for general adult medical examination without abnormal findings: Secondary | ICD-10-CM

## 2016-07-06 DIAGNOSIS — E785 Hyperlipidemia, unspecified: Secondary | ICD-10-CM

## 2016-07-06 NOTE — Telephone Encounter (Signed)
Patient states he has spoken with Dr. Alain Marion and Dr. Alain Marion agreed for patient to have labs before CPE.  Patient has scheduled this in October.  Please enter labs and follow up with patient once entered.

## 2016-07-06 NOTE — Telephone Encounter (Signed)
Cpx labs entered...Steven Carpenter

## 2016-10-04 ENCOUNTER — Encounter: Payer: 59 | Admitting: Internal Medicine

## 2016-10-11 ENCOUNTER — Encounter: Payer: 59 | Admitting: Internal Medicine

## 2016-10-24 ENCOUNTER — Ambulatory Visit (INDEPENDENT_AMBULATORY_CARE_PROVIDER_SITE_OTHER): Payer: 59 | Admitting: Internal Medicine

## 2016-10-24 ENCOUNTER — Encounter: Payer: Self-pay | Admitting: Internal Medicine

## 2016-10-24 ENCOUNTER — Other Ambulatory Visit (INDEPENDENT_AMBULATORY_CARE_PROVIDER_SITE_OTHER): Payer: 59

## 2016-10-24 VITALS — BP 124/80 | HR 68 | Temp 98.2°F | Ht 68.0 in | Wt 182.0 lb

## 2016-10-24 DIAGNOSIS — Z125 Encounter for screening for malignant neoplasm of prostate: Secondary | ICD-10-CM

## 2016-10-24 DIAGNOSIS — E785 Hyperlipidemia, unspecified: Secondary | ICD-10-CM | POA: Diagnosis not present

## 2016-10-24 DIAGNOSIS — Z2911 Encounter for prophylactic immunotherapy for respiratory syncytial virus (RSV): Secondary | ICD-10-CM | POA: Diagnosis not present

## 2016-10-24 DIAGNOSIS — R0989 Other specified symptoms and signs involving the circulatory and respiratory systems: Secondary | ICD-10-CM | POA: Diagnosis not present

## 2016-10-24 DIAGNOSIS — Z1211 Encounter for screening for malignant neoplasm of colon: Secondary | ICD-10-CM

## 2016-10-24 DIAGNOSIS — L570 Actinic keratosis: Secondary | ICD-10-CM

## 2016-10-24 DIAGNOSIS — Z0000 Encounter for general adult medical examination without abnormal findings: Secondary | ICD-10-CM

## 2016-10-24 DIAGNOSIS — Z23 Encounter for immunization: Secondary | ICD-10-CM | POA: Diagnosis not present

## 2016-10-24 DIAGNOSIS — F4321 Adjustment disorder with depressed mood: Secondary | ICD-10-CM

## 2016-10-24 DIAGNOSIS — I10 Essential (primary) hypertension: Secondary | ICD-10-CM

## 2016-10-24 DIAGNOSIS — F4320 Adjustment disorder, unspecified: Secondary | ICD-10-CM | POA: Diagnosis not present

## 2016-10-24 LAB — BASIC METABOLIC PANEL
BUN: 21 mg/dL (ref 6–23)
CALCIUM: 9.5 mg/dL (ref 8.4–10.5)
CO2: 30 meq/L (ref 19–32)
CREATININE: 0.8 mg/dL (ref 0.40–1.50)
Chloride: 103 mEq/L (ref 96–112)
GFR: 104.19 mL/min (ref >60.00)
GLUCOSE: 109 mg/dL — AB (ref 70–99)
Potassium: 4.1 mEq/L (ref 3.5–5.1)
Sodium: 141 mEq/L (ref 135–145)

## 2016-10-24 LAB — CBC WITH DIFFERENTIAL/PLATELET
BASOS ABS: 0.1 10*3/uL (ref 0.0–0.1)
Basophils Relative: 1 % (ref 0.0–3.0)
EOS ABS: 0.2 10*3/uL (ref 0.0–0.7)
Eosinophils Relative: 2.2 % (ref 0.0–5.0)
HCT: 44.3 % (ref 39.0–52.0)
Hemoglobin: 14.9 g/dL (ref 13.0–17.0)
LYMPHS ABS: 1.8 10*3/uL (ref 0.7–4.0)
Lymphocytes Relative: 18 % (ref 12.0–46.0)
MCHC: 33.7 g/dL (ref 30.0–36.0)
MCV: 88.2 fl (ref 78.0–100.0)
MONOS PCT: 4.8 % (ref 3.0–12.0)
Monocytes Absolute: 0.5 10*3/uL (ref 0.1–1.0)
NEUTROS ABS: 7.2 10*3/uL (ref 1.4–7.7)
NEUTROS PCT: 74 % (ref 43.0–77.0)
PLATELETS: 175 10*3/uL (ref 150.0–400.0)
RBC: 5.02 Mil/uL (ref 4.22–5.81)
RDW: 13.6 % (ref 11.5–15.5)
WBC: 9.7 10*3/uL (ref 4.0–10.5)

## 2016-10-24 LAB — URINALYSIS, ROUTINE W REFLEX MICROSCOPIC
Bilirubin Urine: NEGATIVE
Hgb urine dipstick: NEGATIVE
Leukocytes, UA: NEGATIVE
Nitrite: NEGATIVE
RBC / HPF: NONE SEEN (ref 0–?)
SPECIFIC GRAVITY, URINE: 1.025 (ref 1.000–1.030)
Total Protein, Urine: NEGATIVE
URINE GLUCOSE: NEGATIVE
UROBILINOGEN UA: 0.2 (ref 0.0–1.0)
pH: 6 (ref 5.0–8.0)

## 2016-10-24 LAB — LIPID PANEL
CHOLESTEROL: 166 mg/dL (ref 0–200)
HDL: 81.4 mg/dL (ref >39.00)
LDL Cholesterol: 74 mg/dL (ref 0–99)
NonHDL: 84.72
Total CHOL/HDL Ratio: 2
Triglycerides: 52 mg/dL (ref 0.0–149.0)
VLDL: 10.4 mg/dL (ref 0.0–40.0)

## 2016-10-24 LAB — HEPATIC FUNCTION PANEL
ALT: 24 U/L (ref 0–53)
AST: 19 U/L (ref 0–37)
Albumin: 4.6 g/dL (ref 3.5–5.2)
Alkaline Phosphatase: 88 U/L (ref 39–117)
BILIRUBIN DIRECT: 0.2 mg/dL (ref 0.0–0.3)
TOTAL PROTEIN: 7 g/dL (ref 6.0–8.3)
Total Bilirubin: 0.7 mg/dL (ref 0.2–1.2)

## 2016-10-24 LAB — PSA: PSA: 1.86 ng/mL (ref 0.10–4.00)

## 2016-10-24 LAB — TSH: TSH: 1.73 u[IU]/mL (ref 0.35–4.50)

## 2016-10-24 NOTE — Assessment & Plan Note (Signed)
Doing fair 

## 2016-10-24 NOTE — Assessment & Plan Note (Signed)
Seeing a dermatologist

## 2016-10-24 NOTE — Progress Notes (Signed)
Pre visit review using our clinic review tool, if applicable. No additional management support is needed unless otherwise documented below in the visit note. 

## 2016-10-24 NOTE — Assessment & Plan Note (Addendum)
We discussed age appropriate health related issues, including available/recomended screening tests and vaccinations. We discussed a need for adhering to healthy diet and exercise. All questions were answered. EKG Colon test

## 2016-10-24 NOTE — Assessment & Plan Note (Signed)
BP is nl at home Azor

## 2016-10-24 NOTE — Progress Notes (Signed)
Subjective:  Patient ID: Steven Carpenter, male    DOB: 1956-11-04  Age: 60 y.o. MRN: [id number]  CC: Annual Exam   HPI Lannis Heimbuch presents for a well exam  Outpatient Medications Prior to Visit  Medication Sig Dispense Refill  . amLODipine-olmesartan (AZOR) 5-40 MG tablet Take 1 tablet by mouth daily. 90 tablet 3  . aspirin 81 MG tablet Take 81 mg by mouth daily.      [name] atorvastatin (LIPITOR) 10 MG tablet Take 1 tablet (10 mg total) by mouth daily. 90 tablet 3  . beta carotene w/minerals (OCUVITE) tablet Take 1 tablet by mouth daily.    . Cholecalciferol 1000 UNITS tablet Take 1,000 Units by mouth daily.      . fish oil-omega-3 fatty acids 1000 MG capsule Take 2 g by mouth daily.    . Multiple Vitamin (MULTIVITAMIN) tablet Take 1 tablet by mouth daily.    [name] triamcinolone ointment (KENALOG) 0.5 % Apply 1 application topically 2 (two) times daily. 30 g 2   No facility-administered medications prior to visit.     ROS Review of Systems  Constitutional: Negative for appetite change, fatigue and unexpected weight change.  HENT: Negative for congestion, nosebleeds, sneezing, sore throat and trouble swallowing.   Eyes: Negative for itching and visual disturbance.  Respiratory: Negative for cough.   Cardiovascular: Negative for chest pain, palpitations and leg swelling.  Gastrointestinal: Negative for abdominal distention, blood in stool, diarrhea and nausea.  Genitourinary: Negative for frequency and hematuria.  Musculoskeletal: Negative for back pain, gait problem, joint swelling and neck pain.  Skin: Negative for rash.  Neurological: Negative for dizziness, tremors, speech difficulty and weakness.  Psychiatric/Behavioral: Negative for agitation, dysphoric mood, sleep disturbance and suicidal ideas. The patient is not nervous/anxious.     Objective:  BP 130/90   Pulse 68   Temp 98.2 F (36.8 C) (Oral)   Ht 5\' 8"  (1.727 m)   Wt 182 lb (82.6 kg)   SpO2 98%   BMI 27.67 kg/m   BP  Readings from Last 3 Encounters:  10/24/16 130/90  12/16/15 118/80  12/22/14 120/85    Wt Readings from Last 3 Encounters:  10/24/16 182 lb (82.6 kg)  12/16/15 192 lb (87.1 kg)  12/22/14 187 lb (84.8 kg)    Physical Exam  Constitutional: He is oriented to person, place, and time. He appears well-developed and well-nourished. No distress.  HENT:  Head: Normocephalic and atraumatic.  Right Ear: External ear normal.  Left Ear: External ear normal.  Nose: Nose normal.  Mouth/Throat: Oropharynx is clear and moist. No oropharyngeal exudate.  Eyes: Conjunctivae and EOM are normal. Pupils are equal, round, and reactive to light. Right eye exhibits no discharge. Left eye exhibits no discharge. No scleral icterus.  Neck: Normal range of motion. Neck supple. No JVD present. No tracheal deviation present. No thyromegaly present.  Cardiovascular: Normal rate, regular rhythm, normal heart sounds and intact distal pulses.  Exam reveals no gallop and no friction rub.   No murmur heard. Pulmonary/Chest: Effort normal and breath sounds normal. No stridor. No respiratory distress. He has no wheezes. He has no rales. He exhibits no tenderness.  Abdominal: Soft. Bowel sounds are normal. He exhibits no distension and no mass. There is no tenderness. There is no rebound and no guarding.  Genitourinary: Rectum normal and penis normal. Rectal exam shows guaiac negative stool. No penile tenderness.  Musculoskeletal: Normal range of motion. He exhibits no edema or tenderness.  Lymphadenopathy:  He has no cervical adenopathy.  Neurological: He is alert and oriented to person, place, and time. He has normal reflexes. No cranial nerve deficit. He exhibits normal muscle tone. Coordination normal.  Skin: Skin is warm and dry. No rash noted. He is not diaphoretic. No erythema. No pallor.  Psychiatric: He has a normal mood and affect. His behavior is normal. Judgment and thought content normal.  Prostate 1+ Mild  bruit B  Procedure: EKG Indication: well exam Impression: NSR. No acute changes.   Lab Results  Component Value Date   WBC 6.8 12/14/2015   HGB 15.3 12/14/2015   HCT 45.9 12/14/2015   PLT 205.0 12/14/2015   GLUCOSE 96 12/14/2015   CHOL 142 04/21/2016   TRIG 54.0 04/21/2016   HDL 58.40 04/21/2016   LDLDIRECT 176.9 12/15/2013   LDLCALC 73 04/21/2016   ALT 18 04/21/2016   AST 14 04/21/2016   NA 140 12/14/2015   K 4.6 12/14/2015   CL 103 12/14/2015   CREATININE 0.80 12/14/2015   BUN 18 12/14/2015   CO2 30 12/14/2015   TSH 2.19 12/14/2015   PSA 1.71 12/14/2015    No results found.  Assessment & Plan:   There are no diagnoses linked to this encounter. I am having Mr. Arrighi maintain his aspirin, Cholecalciferol, fish oil-omega-3 fatty acids, multivitamin, beta carotene w/minerals, amLODipine-olmesartan, triamcinolone ointment, and atorvastatin.  No orders of the defined types were placed in this encounter.    Follow-up: No Follow-up on file.  Walker Kehr, MD

## 2016-10-24 NOTE — Assessment & Plan Note (Signed)
Car doppler US 

## 2016-10-24 NOTE — Addendum Note (Signed)
Addended by: Cresenciano Lick on: 10/24/2016 12:41 PM   Modules accepted: Orders

## 2016-11-03 ENCOUNTER — Telehealth: Payer: Self-pay

## 2016-11-03 NOTE — Addendum Note (Signed)
Addended by: Aviva Signs M on: 11/03/2016 02:12 PM   Modules accepted: Orders

## 2016-11-03 NOTE — Telephone Encounter (Signed)
-----   Message from Octavio Manns sent at 11/03/2016  2:01 PM EST ----- Can you please change the US Carotid to [id number]

## 2016-11-03 NOTE — Telephone Encounter (Signed)
Request for change of orders per Cecille Rubin. This has been changed.

## 2016-11-07 ENCOUNTER — Encounter: Payer: Self-pay | Admitting: Internal Medicine

## 2016-11-07 NOTE — Telephone Encounter (Addendum)
Unable to find in the system  Thx

## 2016-11-07 NOTE — Telephone Encounter (Signed)
It was only and FYI. The orders have been changed so that the test could be done.

## 2016-11-22 ENCOUNTER — Other Ambulatory Visit: Payer: Self-pay | Admitting: Internal Medicine

## 2016-11-28 ENCOUNTER — Ambulatory Visit (HOSPITAL_COMMUNITY)
Admission: RE | Admit: 2016-11-28 | Discharge: 2016-11-28 | Disposition: A | Discharge status: Home / Self Care | Payer: 59 | Source: Ambulatory Visit | Attending: Cardiology | Admitting: Cardiology

## 2016-11-28 DIAGNOSIS — R0989 Other specified symptoms and signs involving the circulatory and respiratory systems: Secondary | ICD-10-CM

## 2017-01-08 ENCOUNTER — Encounter: Payer: Self-pay | Admitting: Internal Medicine

## 2017-01-09 ENCOUNTER — Ambulatory Visit (AMBULATORY_SURGERY_CENTER): Payer: Self-pay

## 2017-01-09 VITALS — Ht 68.0 in | Wt 190.0 lb

## 2017-01-09 DIAGNOSIS — Z1211 Encounter for screening for malignant neoplasm of colon: Secondary | ICD-10-CM

## 2017-01-09 NOTE — Progress Notes (Signed)
No allergies to eggs or soy No past problems with anesthesia No diet meds No home oxygen  

## 2017-01-23 ENCOUNTER — Encounter: Payer: Self-pay | Admitting: Internal Medicine

## 2017-01-23 ENCOUNTER — Ambulatory Visit (AMBULATORY_SURGERY_CENTER): Payer: 59 | Admitting: Internal Medicine

## 2017-01-23 VITALS — BP 102/64 | HR 54 | Temp 97.8°F | Resp 15 | Ht 68.0 in | Wt 190.0 lb

## 2017-01-23 DIAGNOSIS — Z1212 Encounter for screening for malignant neoplasm of rectum: Secondary | ICD-10-CM

## 2017-01-23 DIAGNOSIS — D122 Benign neoplasm of ascending colon: Secondary | ICD-10-CM | POA: Diagnosis not present

## 2017-01-23 DIAGNOSIS — Z1211 Encounter for screening for malignant neoplasm of colon: Secondary | ICD-10-CM | POA: Diagnosis present

## 2017-01-23 MED ORDER — SODIUM CHLORIDE 0.9 % IV SOLN: 500.0000 mL | INTRAVENOUS | Status: DC

## 2017-01-23 NOTE — Progress Notes (Signed)
To recovery report to Judson Roch RN VSS

## 2017-01-23 NOTE — Progress Notes (Signed)
Pt's states no medical or surgical changes since previsit or office visit. 

## 2017-01-23 NOTE — Patient Instructions (Addendum)
I found and removed one polyp that looks benign.  I will let you know pathology results and when to have another routine colonoscopy by mail.  I appreciate the opportunity to care for you. Gatha Mayer, MD, FACG   YOU HAD AN ENDOSCOPIC PROCEDURE TODAY AT [facility]:   Refer to the procedure report that was given to you for any specific questions about what was found during the examination.  If the procedure report does not answer your questions, please call your gastroenterologist to clarify.  If you requested that your care partner not be given the details of your procedure findings, then the procedure report has been included in a sealed envelope for you to review at your convenience later.  YOU SHOULD EXPECT: Some feelings of bloating in the abdomen. Passage of more gas than usual.  Walking can help get rid of the air that was put into your GI tract during the procedure and reduce the bloating. If you had a lower endoscopy (such as a colonoscopy or flexible sigmoidoscopy) you may notice spotting of blood in your stool or on the toilet paper. If you underwent a bowel prep for your procedure, you may not have a normal bowel movement for a few days.  Please Note:  You might notice some irritation and congestion in your nose or some drainage.  This is from the oxygen used during your procedure.  There is no need for concern and it should clear up in a day or so.  SYMPTOMS TO REPORT IMMEDIATELY:   Following lower endoscopy (colonoscopy or flexible sigmoidoscopy):  Excessive amounts of blood in the stool  Significant tenderness or worsening of abdominal pains  Swelling of the abdomen that is new, acute  Fever of 100F or higher   For urgent or emergent issues, a gastroenterologist can be reached at any hour by calling [phone].  Please read all handouts give to you by your recovery nurse.  DIET:  We do recommend a small meal at first, but then you may  proceed to your regular diet.  Drink plenty of fluids but you should avoid alcoholic beverages for 24 hours.  ACTIVITY:  You should plan to take it easy for the rest of today and you should NOT DRIVE or use heavy machinery until tomorrow (because of the sedation medicines used during the test).    FOLLOW UP: Our staff will call the number listed on your records the next business day following your procedure to check on you and address any questions or concerns that you may have regarding the information given to you following your procedure. If we do not reach you, we will leave a message.  However, if you are feeling well and you are not experiencing any problems, there is no need to return our call.  We will assume that you have returned to your regular daily activities without incident.  If any biopsies were taken you will be contacted by phone or by letter within the next 1-3 weeks.  Please call us at [phone] if you have not heard about the biopsies in 3 weeks.    SIGNATURES/CONFIDENTIALITY: You and/or your care partner have signed paperwork which will be entered into your electronic medical record.  These signatures attest to the fact that that the information above on your After Visit Summary has been reviewed and is understood.  Full responsibility of the confidentiality of this discharge information lies with you and/or your care-partner.  Thank  you for letting us take care of your healthcare needs today. 

## 2017-01-23 NOTE — Op Note (Signed)
[facility] Patient Name: Steven Carpenter Procedure Date: 01/23/2017 11:24 AM MRN: [id number] Endoscopist: Gatha Mayer , MD Age: 61 Referring MD:  Date of Birth: [birth year] Gender: Male Account #: [id number] Procedure:                Colonoscopy Indications:              Screening for colorectal malignant neoplasm, Last                            colonoscopy: 2007 Medicines:                Propofol per Anesthesia, Monitored Anesthesia Care Procedure:                Pre-Anesthesia Assessment:                           - Prior to the procedure, a History and Physical                            was performed, and patient medications and                            allergies were reviewed. The patient's tolerance of                            previous anesthesia was also reviewed. The risks                            and benefits of the procedure and the sedation                            options and risks were discussed with the patient.                            All questions were answered, and informed consent                            was obtained. Prior Anticoagulants: The patient                            last took aspirin 1 day prior to the procedure. ASA                            Grade Assessment: II - A patient with mild systemic                            disease. After reviewing the risks and benefits,                            the patient was deemed in satisfactory condition to                            undergo the procedure.  After obtaining informed consent, the colonoscope                            was passed under direct vision. Throughout the                            procedure, the patient's blood pressure, pulse, and                            oxygen saturations were monitored continuously. The                            Model CF-HQ190L [id number]) scope was introduced                            through the anus and advanced to  the the cecum,                            identified by appendiceal orifice and ileocecal                            valve. The colonoscopy was performed without                            difficulty. The patient tolerated the procedure                            well. The quality of the bowel preparation was                            adequate. The bowel preparation used was Miralax.                            The ileocecal valve, appendiceal orifice, and                            rectum were photographed. Scope In: 11:35:37 AM Scope Out: 11:55:46 AM Scope Withdrawal Time: 0 hours 17 minutes 11 seconds  Total Procedure Duration: 0 hours 20 minutes 9 seconds  Findings:                 The perianal and digital rectal examinations were                            normal. Pertinent negatives include normal prostate                            (size, shape, and consistency).                           A 10 mm polyp was found in the ascending colon. The                            polyp was sessile. The polyp was removed with a  cold snare. Resection and retrieval were complete.                            Verification of patient identification for the                            specimen was done. Estimated blood loss was minimal.                           The exam was otherwise without abnormality on                            direct and retroflexion views. Complications:            No immediate complications. Estimated Blood Loss:     Estimated blood loss was minimal. Impression:               - One 10 mm polyp in the ascending colon, removed                            with a cold snare. Resected and retrieved.                           - The examination was otherwise normal on direct                            and retroflexion views. Recommendation:           - Patient has a contact number available for                            emergencies. The signs and symptoms of  potential                            delayed complications were discussed with the                            patient. Return to normal activities tomorrow.                            Written discharge instructions were provided to the                            patient.                           - Resume previous diet.                           - Continue present medications.                           - Repeat colonoscopy is recommended for                            surveillance. The colonoscopy date will be  determined after pathology results from today's                            exam become available for review. Gatha Mayer, MD 01/23/2017 12:06:37 PM This report has been signed electronically.

## 2017-01-24 ENCOUNTER — Telehealth: Payer: Self-pay

## 2017-01-24 NOTE — Telephone Encounter (Signed)
  Follow up Call-  Call back number 01/23/2017  Post procedure Call Back phone  # [phone]  Permission to leave phone message No  Some recent data might be hidden     Patient questions:  Do you have a fever, pain , or abdominal swelling? No. Pain Score  0 *  Have you tolerated food without any problems? Yes.    Have you been able to return to your normal activities? Yes.    Do you have any questions about your discharge instructions: Diet   No. Medications  No. Follow up visit  No.  Do you have questions or concerns about your Care? No.  Actions: * If pain score is 4 or above: No action needed, pain <4.

## 2017-02-01 ENCOUNTER — Encounter: Payer: Self-pay | Admitting: Internal Medicine

## 2017-02-01 DIAGNOSIS — Z86010 Personal history of colonic polyps: Secondary | ICD-10-CM

## 2017-02-01 DIAGNOSIS — Z860101 Personal history of adenomatous and serrated colon polyps: Secondary | ICD-10-CM | POA: Insufficient documentation

## 2017-02-01 HISTORY — DX: Personal history of colonic polyps: Z86.010

## 2017-02-01 HISTORY — DX: Personal history of adenomatous and serrated colon polyps: Z86.0101

## 2017-02-01 NOTE — Progress Notes (Signed)
10 mm adenoma recall 2021 Letter by My Chart

## 2017-03-19 ENCOUNTER — Telehealth: Payer: Self-pay | Admitting: Internal Medicine

## 2017-03-19 NOTE — Telephone Encounter (Signed)
Pt called in and said that pharmacy changed to  [facility] [phone]  They need a copy of all pt script to put on file

## 2017-03-20 ENCOUNTER — Other Ambulatory Visit: Payer: Self-pay

## 2017-03-20 NOTE — Telephone Encounter (Signed)
Pharm changed on chart

## 2017-03-27 MED ORDER — AMLODIPINE-OLMESARTAN 5-40 MG PO TABS: 1.0000 | ORAL_TABLET | Freq: Every day | ORAL | 3 refills | Status: DC

## 2017-03-27 MED ORDER — ATORVASTATIN CALCIUM 10 MG PO TABS: 10.0000 mg | ORAL_TABLET | Freq: Every day | ORAL | 3 refills | Status: DC

## 2017-03-27 NOTE — Telephone Encounter (Signed)
erx sent to new pharmacy.

## 2017-09-20 ENCOUNTER — Telehealth: Payer: Self-pay | Admitting: Internal Medicine

## 2017-09-20 DIAGNOSIS — Z0000 Encounter for general adult medical examination without abnormal findings: Secondary | ICD-10-CM

## 2017-09-20 NOTE — Telephone Encounter (Signed)
Patient has appt on 10/2.  Would like labs entered before CPE.

## 2017-09-21 NOTE — Telephone Encounter (Signed)
Labs entered.

## 2017-09-25 ENCOUNTER — Encounter: Payer: 59 | Admitting: Internal Medicine

## 2017-09-28 NOTE — Telephone Encounter (Signed)
Pt aware.

## 2017-10-01 ENCOUNTER — Other Ambulatory Visit (INDEPENDENT_AMBULATORY_CARE_PROVIDER_SITE_OTHER): Payer: 59

## 2017-10-01 DIAGNOSIS — Z0000 Encounter for general adult medical examination without abnormal findings: Secondary | ICD-10-CM

## 2017-10-01 LAB — LIPID PANEL
CHOLESTEROL: 163 mg/dL (ref 0–200)
HDL: 78.1 mg/dL (ref >39.00)
LDL Cholesterol: 74 mg/dL (ref 0–99)
NonHDL: 84.82
Total CHOL/HDL Ratio: 2
Triglycerides: 56 mg/dL (ref 0.0–149.0)
VLDL: 11.2 mg/dL (ref 0.0–40.0)

## 2017-10-01 LAB — COMPREHENSIVE METABOLIC PANEL
ALBUMIN: 4.3 g/dL (ref 3.5–5.2)
ALK PHOS: 72 U/L (ref 39–117)
ALT: 24 U/L (ref 0–53)
AST: 17 U/L (ref 0–37)
BILIRUBIN TOTAL: 0.6 mg/dL (ref 0.2–1.2)
BUN: 25 mg/dL — ABNORMAL HIGH (ref 6–23)
CALCIUM: 8.8 mg/dL (ref 8.4–10.5)
CO2: 25 mEq/L (ref 19–32)
Chloride: 107 mEq/L (ref 96–112)
Creatinine, Ser: 0.8 mg/dL (ref 0.40–1.50)
GFR: 103.87 mL/min (ref >60.00)
Glucose, Bld: 92 mg/dL (ref 70–99)
POTASSIUM: 3.7 meq/L (ref 3.5–5.1)
Sodium: 141 mEq/L (ref 135–145)
TOTAL PROTEIN: 6.8 g/dL (ref 6.0–8.3)

## 2017-10-01 LAB — URINALYSIS
BILIRUBIN URINE: NEGATIVE
KETONES UR: NEGATIVE
LEUKOCYTES UA: NEGATIVE
NITRITE: NEGATIVE
PH: 5.5 (ref 5.0–8.0)
Specific Gravity, Urine: >=1.03 — AB (ref 1.000–1.030)
Total Protein, Urine: NEGATIVE
UROBILINOGEN UA: 0.2 (ref 0.0–1.0)
Urine Glucose: NEGATIVE

## 2017-10-01 LAB — CBC WITH DIFFERENTIAL/PLATELET
Basophils Absolute: 0 10*3/uL (ref 0.0–0.1)
Basophils Relative: 0.5 % (ref 0.0–3.0)
EOS PCT: 2.6 % (ref 0.0–5.0)
Eosinophils Absolute: 0.2 10*3/uL (ref 0.0–0.7)
HEMATOCRIT: 41 % (ref 39.0–52.0)
HEMOGLOBIN: 13.8 g/dL (ref 13.0–17.0)
LYMPHS ABS: 1.6 10*3/uL (ref 0.7–4.0)
LYMPHS PCT: 23.3 % (ref 12.0–46.0)
MCHC: 33.6 g/dL (ref 30.0–36.0)
MCV: 89.3 fl (ref 78.0–100.0)
MONOS PCT: 6.2 % (ref 3.0–12.0)
Monocytes Absolute: 0.4 10*3/uL (ref 0.1–1.0)
NEUTROS PCT: 67.4 % (ref 43.0–77.0)
Neutro Abs: 4.7 10*3/uL (ref 1.4–7.7)
Platelets: 177 10*3/uL (ref 150.0–400.0)
RBC: 4.59 Mil/uL (ref 4.22–5.81)
RDW: 14.2 % (ref 11.5–15.5)
WBC: 7 10*3/uL (ref 4.0–10.5)

## 2017-10-01 LAB — HEPATIC FUNCTION PANEL
ALT: 24 U/L (ref 0–53)
AST: 17 U/L (ref 0–37)
Albumin: 4.3 g/dL (ref 3.5–5.2)
Alkaline Phosphatase: 72 U/L (ref 39–117)
BILIRUBIN DIRECT: 0.1 mg/dL (ref 0.0–0.3)
BILIRUBIN TOTAL: 0.6 mg/dL (ref 0.2–1.2)
TOTAL PROTEIN: 6.8 g/dL (ref 6.0–8.3)

## 2017-10-01 LAB — TSH: TSH: 2.89 u[IU]/mL (ref 0.35–4.50)

## 2017-10-04 ENCOUNTER — Encounter: Payer: Self-pay | Admitting: Internal Medicine

## 2017-10-04 ENCOUNTER — Other Ambulatory Visit (INDEPENDENT_AMBULATORY_CARE_PROVIDER_SITE_OTHER): Payer: 59

## 2017-10-04 ENCOUNTER — Ambulatory Visit (INDEPENDENT_AMBULATORY_CARE_PROVIDER_SITE_OTHER): Payer: 59 | Admitting: Internal Medicine

## 2017-10-04 VITALS — BP 120/84 | HR 59 | Temp 98.1°F | Wt 194.0 lb

## 2017-10-04 DIAGNOSIS — I10 Essential (primary) hypertension: Secondary | ICD-10-CM

## 2017-10-04 DIAGNOSIS — Z0000 Encounter for general adult medical examination without abnormal findings: Secondary | ICD-10-CM | POA: Diagnosis not present

## 2017-10-04 DIAGNOSIS — Z23 Encounter for immunization: Secondary | ICD-10-CM

## 2017-10-04 DIAGNOSIS — R972 Elevated prostate specific antigen [PSA]: Secondary | ICD-10-CM

## 2017-10-04 LAB — PSA: PSA: 2.66 ng/mL (ref 0.10–4.00)

## 2017-10-04 NOTE — Addendum Note (Signed)
Addended by: Karren Cobble on: 10/04/2017 02:15 PM   Modules accepted: Orders

## 2017-10-04 NOTE — Assessment & Plan Note (Addendum)
We discussed age appropriate health related issues, including available/recomended screening tests and vaccinations. We discussed a need for adhering to healthy diet and exercise. Labs were ordered to be later reviewed . All questions were answered. EKG PSA

## 2017-10-04 NOTE — Progress Notes (Signed)
Subjective:  Patient ID: Steven Carpenter, male    DOB: 02-01-1956  Age: 61 y.o. MRN: [id number]  CC: No chief complaint on file.   HPI Kevyn Boquet presents for a well exam C/o LBP   Outpatient Medications Prior to Visit  Medication Sig Dispense Refill  . amLODipine-olmesartan (AZOR) 5-40 MG tablet Take 1 tablet by mouth daily. 90 tablet 3  . aspirin 81 MG tablet Take 81 mg by mouth daily.      [name] atorvastatin (LIPITOR) 10 MG tablet Take 1 tablet (10 mg total) by mouth daily. 90 tablet 3  . beta carotene w/minerals (OCUVITE) tablet Take 1 tablet by mouth daily.    . Cholecalciferol 1000 UNITS tablet Take 1,000 Units by mouth daily.      . fish oil-omega-3 fatty acids 1000 MG capsule Take 2 g by mouth daily.    . Multiple Vitamin (MULTIVITAMIN) tablet Take 1 tablet by mouth daily.     Facility-Administered Medications Prior to Visit  Medication Dose Route Frequency Provider Last Rate Last Dose  . 0.9 %  sodium chloride infusion  500 mL Intravenous Continuous Gatha Mayer, MD        ROS Review of Systems  Constitutional: Negative for appetite change, fatigue and unexpected weight change.  HENT: Negative for congestion, nosebleeds, sneezing, sore throat and trouble swallowing.   Eyes: Negative for itching and visual disturbance.  Respiratory: Negative for cough.   Cardiovascular: Negative for chest pain, palpitations and leg swelling.  Gastrointestinal: Negative for abdominal distention, blood in stool, diarrhea and nausea.  Genitourinary: Negative for frequency and hematuria.  Musculoskeletal: Negative for back pain, gait problem, joint swelling and neck pain.  Skin: Negative for rash.  Neurological: Negative for dizziness, tremors, speech difficulty and weakness.  Psychiatric/Behavioral: Negative for agitation, dysphoric mood and sleep disturbance. The patient is not nervous/anxious.     Objective:  BP 120/84   Pulse (!) 59   Temp 98.1 F (36.7 C)   Wt 194 lb (88 kg)    SpO2 99%   BMI 29.50 kg/m   BP Readings from Last 3 Encounters:  10/04/17 120/84  01/23/17 102/64  10/24/16 124/80    Wt Readings from Last 3 Encounters:  10/04/17 194 lb (88 kg)  01/23/17 190 lb (86.2 kg)  01/09/17 190 lb (86.2 kg)    Physical Exam  Constitutional: He is oriented to person, place, and time. He appears well-developed and well-nourished. No distress.  HENT:  Head: Normocephalic and atraumatic.  Right Ear: External ear normal.  Left Ear: External ear normal.  Nose: Nose normal.  Mouth/Throat: Oropharynx is clear and moist. No oropharyngeal exudate.  Eyes: Pupils are equal, round, and reactive to light. Conjunctivae and EOM are normal. Right eye exhibits no discharge. Left eye exhibits no discharge. No scleral icterus.  Neck: Normal range of motion. Neck supple. No JVD present. No tracheal deviation present. No thyromegaly present.  Cardiovascular: Normal rate, regular rhythm, normal heart sounds and intact distal pulses.  Exam reveals no gallop and no friction rub.   No murmur heard. Pulmonary/Chest: Effort normal and breath sounds normal. No stridor. No respiratory distress. He has no wheezes. He has no rales. He exhibits no tenderness.  Abdominal: Soft. Bowel sounds are normal. He exhibits no distension and no mass. There is no tenderness. There is no rebound and no guarding.  Genitourinary: Rectum normal and penis normal. Rectal exam shows guaiac negative stool. No penile tenderness.  Musculoskeletal: Normal range of motion. He exhibits  no edema or tenderness.  Lymphadenopathy:    He has no cervical adenopathy.  Neurological: He is alert and oriented to person, place, and time. He has normal reflexes. No cranial nerve deficit. He exhibits normal muscle tone. Coordination normal.  Skin: Skin is warm and dry. No rash noted. He is not diaphoretic. No erythema. No pallor.  Psychiatric: He has a normal mood and affect. His behavior is normal. Judgment and thought  content normal.  prostate 1+ P knees NT LS w/full ROM  Procedure: EKG Indication: well exam Impression: NSR. No acute changes.   Lab Results  Component Value Date   WBC 7.0 10/01/2017   HGB 13.8 10/01/2017   HCT 41.0 10/01/2017   PLT 177.0 10/01/2017   GLUCOSE 92 10/01/2017   CHOL 163 10/01/2017   TRIG 56.0 10/01/2017   HDL 78.10 10/01/2017   LDLDIRECT 176.9 12/15/2013   LDLCALC 74 10/01/2017   ALT 24 10/01/2017   ALT 24 10/01/2017   AST 17 10/01/2017   AST 17 10/01/2017   NA 141 10/01/2017   K 3.7 10/01/2017   CL 107 10/01/2017   CREATININE 0.80 10/01/2017   BUN 25 (H) 10/01/2017   CO2 25 10/01/2017   TSH 2.89 10/01/2017   PSA 1.86 10/24/2016    No results found.  Assessment & Plan:   Diagnoses and all orders for this visit:  Essential hypertension -     EKG 12-Lead  Well adult exam   I am having Mr. Worden maintain his aspirin, Cholecalciferol, fish oil-omega-3 fatty acids, multivitamin, beta carotene w/minerals, amLODipine-olmesartan, and atorvastatin. We will continue to administer sodium chloride.  No orders of the defined types were placed in this encounter.    Follow-up: No Follow-up on file.  Walker Kehr, MD

## 2017-10-04 NOTE — Assessment & Plan Note (Addendum)
On Azor Re-checked BP was ok

## 2018-06-08 ENCOUNTER — Other Ambulatory Visit: Payer: Self-pay | Admitting: Internal Medicine

## 2018-10-10 ENCOUNTER — Encounter: Payer: 59 | Admitting: Internal Medicine

## 2018-10-17 ENCOUNTER — Encounter: Payer: Self-pay | Admitting: Internal Medicine

## 2018-10-17 ENCOUNTER — Ambulatory Visit (INDEPENDENT_AMBULATORY_CARE_PROVIDER_SITE_OTHER): Payer: 59 | Admitting: Internal Medicine

## 2018-10-17 ENCOUNTER — Other Ambulatory Visit (INDEPENDENT_AMBULATORY_CARE_PROVIDER_SITE_OTHER): Payer: 59

## 2018-10-17 VITALS — BP 128/84 | HR 52 | Temp 97.7°F | Ht 68.0 in | Wt 183.0 lb

## 2018-10-17 DIAGNOSIS — Z0000 Encounter for general adult medical examination without abnormal findings: Secondary | ICD-10-CM

## 2018-10-17 DIAGNOSIS — E785 Hyperlipidemia, unspecified: Secondary | ICD-10-CM

## 2018-10-17 DIAGNOSIS — Z23 Encounter for immunization: Secondary | ICD-10-CM

## 2018-10-17 DIAGNOSIS — I10 Essential (primary) hypertension: Secondary | ICD-10-CM | POA: Diagnosis not present

## 2018-10-17 LAB — URINALYSIS
Bilirubin Urine: NEGATIVE
HGB URINE DIPSTICK: NEGATIVE
Ketones, ur: NEGATIVE
Leukocytes, UA: NEGATIVE
NITRITE: NEGATIVE
Specific Gravity, Urine: 1.02 (ref 1.000–1.030)
TOTAL PROTEIN, URINE-UPE24: NEGATIVE
Urine Glucose: NEGATIVE
Urobilinogen, UA: 0.2 (ref 0.0–1.0)
pH: 7 (ref 5.0–8.0)

## 2018-10-17 LAB — HEPATIC FUNCTION PANEL
ALT: 13 U/L (ref 0–53)
AST: 13 U/L (ref 0–37)
Albumin: 4.4 g/dL (ref 3.5–5.2)
Alkaline Phosphatase: 71 U/L (ref 39–117)
BILIRUBIN DIRECT: 0.1 mg/dL (ref 0.0–0.3)
TOTAL PROTEIN: 6.7 g/dL (ref 6.0–8.3)
Total Bilirubin: 0.7 mg/dL (ref 0.2–1.2)

## 2018-10-17 LAB — CBC WITH DIFFERENTIAL/PLATELET
BASOS PCT: 0.7 % (ref 0.0–3.0)
Basophils Absolute: 0 10*3/uL (ref 0.0–0.1)
EOS ABS: 0.1 10*3/uL (ref 0.0–0.7)
Eosinophils Relative: 2.4 % (ref 0.0–5.0)
HEMATOCRIT: 41 % (ref 39.0–52.0)
Hemoglobin: 14 g/dL (ref 13.0–17.0)
LYMPHS PCT: 20.9 % (ref 12.0–46.0)
Lymphs Abs: 1.2 10*3/uL (ref 0.7–4.0)
MCHC: 34.1 g/dL (ref 30.0–36.0)
MCV: 89.1 fl (ref 78.0–100.0)
MONOS PCT: 7 % (ref 3.0–12.0)
Monocytes Absolute: 0.4 10*3/uL (ref 0.1–1.0)
NEUTROS ABS: 4.1 10*3/uL (ref 1.4–7.7)
Neutrophils Relative %: 69 % (ref 43.0–77.0)
Platelets: 183 10*3/uL (ref 150.0–400.0)
RBC: 4.6 Mil/uL (ref 4.22–5.81)
RDW: 13.9 % (ref 11.5–15.5)
WBC: 5.9 10*3/uL (ref 4.0–10.5)

## 2018-10-17 LAB — LIPID PANEL
CHOLESTEROL: 150 mg/dL (ref 0–200)
HDL: 61 mg/dL (ref >39.00)
LDL CALC: 71 mg/dL (ref 0–99)
NonHDL: 88.96
TRIGLYCERIDES: 92 mg/dL (ref 0.0–149.0)
Total CHOL/HDL Ratio: 2
VLDL: 18.4 mg/dL (ref 0.0–40.0)

## 2018-10-17 LAB — BASIC METABOLIC PANEL
BUN: 14 mg/dL (ref 6–23)
CALCIUM: 9.2 mg/dL (ref 8.4–10.5)
CO2: 27 mEq/L (ref 19–32)
CREATININE: 0.77 mg/dL (ref 0.40–1.50)
Chloride: 104 mEq/L (ref 96–112)
GFR: 108.19 mL/min (ref >60.00)
Glucose, Bld: 97 mg/dL (ref 70–99)
POTASSIUM: 3.9 meq/L (ref 3.5–5.1)
Sodium: 140 mEq/L (ref 135–145)

## 2018-10-17 LAB — PSA: PSA: 2.18 ng/mL (ref 0.10–4.00)

## 2018-10-17 LAB — TSH: TSH: 2.28 u[IU]/mL (ref 0.35–4.50)

## 2018-10-17 MED ORDER — ATORVASTATIN CALCIUM 10 MG PO TABS: 10.0000 mg | ORAL_TABLET | Freq: Every day | ORAL | 3 refills | Status: DC

## 2018-10-17 MED ORDER — AMLODIPINE-OLMESARTAN 5-40 MG PO TABS: 1.0000 | ORAL_TABLET | Freq: Every day | ORAL | 3 refills | Status: DC

## 2018-10-17 NOTE — Assessment & Plan Note (Signed)
We discussed age appropriate health related issues, including available/recomended screening tests and vaccinations. We discussed a need for adhering to healthy diet and exercise. Labs were ordered to be later reviewed . All questions were answered.   

## 2018-10-17 NOTE — Assessment & Plan Note (Signed)
Lipitor 

## 2018-10-17 NOTE — Assessment & Plan Note (Signed)
Azor 

## 2018-10-17 NOTE — Progress Notes (Signed)
Subjective:  Patient ID: Steven Carpenter, male    DOB: 1956/01/27  Age: 62 y.o. MRN: [id number]  CC: No chief complaint on file.   HPI Steven Carpenter presents for a well exam  Outpatient Medications Prior to Visit  Medication Sig Dispense Refill  . amLODipine-olmesartan (AZOR) 5-40 MG tablet Take 1 tablet by mouth daily. Annual appt due in Oct must see provider for future refills 90 tablet 1  . aspirin 81 MG tablet Take 81 mg by mouth daily.      [name] atorvastatin (LIPITOR) 10 MG tablet Take 1 tablet (10 mg total) by mouth daily. Annual appt due in Oct must see provider for future refills 90 tablet 1  . beta carotene w/minerals (OCUVITE) tablet Take 1 tablet by mouth daily.    . Cholecalciferol 1000 UNITS tablet Take 1,000 Units by mouth daily.      . fish oil-omega-3 fatty acids 1000 MG capsule Take 2 g by mouth daily.    . Multiple Vitamin (MULTIVITAMIN) tablet Take 1 tablet by mouth daily.     No facility-administered medications prior to visit.     ROS: Review of Systems  Constitutional: Negative for appetite change, fatigue and unexpected weight change.  HENT: Negative for congestion, nosebleeds, sneezing, sore throat and trouble swallowing.   Eyes: Negative for itching and visual disturbance.  Respiratory: Negative for cough.   Cardiovascular: Negative for chest pain, palpitations and leg swelling.  Gastrointestinal: Negative for abdominal distention, blood in stool, diarrhea and nausea.  Genitourinary: Negative for frequency and hematuria.  Musculoskeletal: Negative for back pain, gait problem, joint swelling and neck pain.  Skin: Negative for rash.  Neurological: Negative for dizziness, tremors, speech difficulty and weakness.  Psychiatric/Behavioral: Negative for agitation, dysphoric mood, sleep disturbance and suicidal ideas. The patient is not nervous/anxious.     Objective:  BP 128/84 (BP Location: Left Arm, Patient Position: Sitting, Cuff Size: Normal)   Pulse (!) 52    Temp 97.7 F (36.5 C) (Oral)   Ht 5\' 8"  (1.727 m)   Wt 183 lb (83 kg)   SpO2 98%   BMI 27.83 kg/m   BP Readings from Last 3 Encounters:  10/17/18 128/84  10/04/17 120/84  01/23/17 102/64    Wt Readings from Last 3 Encounters:  10/17/18 183 lb (83 kg)  10/04/17 194 lb (88 kg)  01/23/17 190 lb (86.2 kg)    Physical Exam  Constitutional: He is oriented to person, place, and time. He appears well-developed. No distress.  NAD  HENT:  Mouth/Throat: Oropharynx is clear and moist.  Eyes: Pupils are equal, round, and reactive to light. Conjunctivae are normal.  Neck: Normal range of motion. No JVD present. No thyromegaly present.  Cardiovascular: Normal rate, regular rhythm, normal heart sounds and intact distal pulses. Exam reveals no gallop and no friction rub.  No murmur heard. Pulmonary/Chest: Effort normal and breath sounds normal. No respiratory distress. He has no wheezes. He has no rales. He exhibits no tenderness.  Abdominal: Soft. Bowel sounds are normal. He exhibits no distension and no mass. There is no tenderness. There is no rebound and no guarding.  Musculoskeletal: Normal range of motion. He exhibits no edema or tenderness.  Lymphadenopathy:    He has no cervical adenopathy.  Neurological: He is alert and oriented to person, place, and time. He has normal reflexes. No cranial nerve deficit. He exhibits normal muscle tone. He displays a negative Romberg sign. Coordination and gait normal.  Skin: Skin is warm and  dry. No rash noted.  Psychiatric: He has a normal mood and affect. His behavior is normal. Judgment and thought content normal.   1/6 heart murmur   Procedure: EKG Indication: dyslipidemia Impression: S brady. No acute changes.  Lab Results  Component Value Date   WBC 7.0 10/01/2017   HGB 13.8 10/01/2017   HCT 41.0 10/01/2017   PLT 177.0 10/01/2017   GLUCOSE 92 10/01/2017   CHOL 163 10/01/2017   TRIG 56.0 10/01/2017   HDL 78.10 10/01/2017    LDLDIRECT 176.9 12/15/2013   LDLCALC 74 10/01/2017   ALT 24 10/01/2017   ALT 24 10/01/2017   AST 17 10/01/2017   AST 17 10/01/2017   NA 141 10/01/2017   K 3.7 10/01/2017   CL 107 10/01/2017   CREATININE 0.80 10/01/2017   BUN 25 (H) 10/01/2017   CO2 25 10/01/2017   TSH 2.89 10/01/2017   PSA 2.66 10/04/2017    No results found.  Assessment & Plan:   There are no diagnoses linked to this encounter.   No orders of the defined types were placed in this encounter.    Follow-up: No follow-ups on file.  Walker Kehr, MD

## 2018-10-17 NOTE — Addendum Note (Signed)
Addended by: Karren Cobble on: 10/17/2018 09:03 AM   Modules accepted: Orders

## 2018-10-17 NOTE — Patient Instructions (Signed)

## 2018-11-26 ENCOUNTER — Other Ambulatory Visit: Payer: Self-pay | Admitting: Internal Medicine

## 2018-12-13 ENCOUNTER — Telehealth: Payer: Self-pay | Admitting: Internal Medicine

## 2018-12-13 NOTE — Telephone Encounter (Signed)
Copied from [place] [phone]. Topic: General - Other >> Dec 13, 2018  4:01 PM Alfredia Ferguson R wrote: Patient called in and stated his current pharmacy doesn't carry amLODipine-olmesartan (AZOR) 5-40 MG tablet.

## 2018-12-16 NOTE — Telephone Encounter (Signed)
All pharmacies have this medication on back order, please advise about alternative

## 2018-12-17 MED ORDER — OLMESARTAN MEDOXOMIL 40 MG PO TABS: 40.0000 mg | ORAL_TABLET | Freq: Every day | ORAL | 3 refills | Status: DC

## 2018-12-17 MED ORDER — AMLODIPINE BESYLATE 5 MG PO TABS: 5.0000 mg | ORAL_TABLET | Freq: Every day | ORAL | 3 refills | Status: DC

## 2018-12-17 NOTE — Telephone Encounter (Signed)
LM notifying pt

## 2018-12-17 NOTE — Telephone Encounter (Signed)
I'll divide the Rx into 2 separate Rx Thx

## 2019-06-19 ENCOUNTER — Other Ambulatory Visit: Payer: Self-pay | Admitting: Internal Medicine

## 2019-09-12 ENCOUNTER — Telehealth: Payer: Self-pay

## 2019-09-12 DIAGNOSIS — Z0000 Encounter for general adult medical examination without abnormal findings: Secondary | ICD-10-CM

## 2019-09-12 NOTE — Telephone Encounter (Signed)
Copied from [place] [id number]. Topic: Appointment Scheduling - Scheduling Inquiry for Clinic >> Sep 10, 2019  8:22 AM Lennox Solders wrote: Reason for CRM: pt is calling and would like cpe labs in advance. Pt has cpe on 10-20-2019

## 2019-09-12 NOTE — Telephone Encounter (Signed)
labs ordered

## 2019-10-15 ENCOUNTER — Ambulatory Visit: Payer: 59

## 2019-10-15 ENCOUNTER — Other Ambulatory Visit (INDEPENDENT_AMBULATORY_CARE_PROVIDER_SITE_OTHER): Payer: 59

## 2019-10-15 DIAGNOSIS — Z0000 Encounter for general adult medical examination without abnormal findings: Secondary | ICD-10-CM | POA: Diagnosis not present

## 2019-10-15 LAB — BASIC METABOLIC PANEL WITH GFR
BUN: 16 mg/dL (ref 6–23)
CO2: 29 meq/L (ref 19–32)
Calcium: 9.3 mg/dL (ref 8.4–10.5)
Chloride: 102 meq/L (ref 96–112)
Creatinine, Ser: 0.83 mg/dL (ref 0.40–1.50)
GFR: 93.06 mL/min
Glucose, Bld: 96 mg/dL (ref 70–99)
Potassium: 4 meq/L (ref 3.5–5.1)
Sodium: 139 meq/L (ref 135–145)

## 2019-10-15 LAB — CBC WITH DIFFERENTIAL/PLATELET
Basophils Absolute: 0 10*3/uL (ref 0.0–0.1)
Basophils Relative: 0.6 % (ref 0.0–3.0)
Eosinophils Absolute: 0.2 10*3/uL (ref 0.0–0.7)
Eosinophils Relative: 3.1 % (ref 0.0–5.0)
HCT: 44 % (ref 39.0–52.0)
Hemoglobin: 14.8 g/dL (ref 13.0–17.0)
Lymphocytes Relative: 21.9 % (ref 12.0–46.0)
Lymphs Abs: 1.4 10*3/uL (ref 0.7–4.0)
MCHC: 33.7 g/dL (ref 30.0–36.0)
MCV: 88.7 fl (ref 78.0–100.0)
Monocytes Absolute: 0.5 10*3/uL (ref 0.1–1.0)
Monocytes Relative: 8 % (ref 3.0–12.0)
Neutro Abs: 4.3 10*3/uL (ref 1.4–7.7)
Neutrophils Relative %: 66.4 % (ref 43.0–77.0)
Platelets: 198 10*3/uL (ref 150.0–400.0)
RBC: 4.96 Mil/uL (ref 4.22–5.81)
RDW: 14 % (ref 11.5–15.5)
WBC: 6.5 10*3/uL (ref 4.0–10.5)

## 2019-10-15 LAB — URINALYSIS
Bilirubin Urine: NEGATIVE
Hgb urine dipstick: NEGATIVE
Ketones, ur: NEGATIVE
Leukocytes,Ua: NEGATIVE
Nitrite: NEGATIVE
Specific Gravity, Urine: 1.02 (ref 1.000–1.030)
Total Protein, Urine: NEGATIVE
Urine Glucose: NEGATIVE
Urobilinogen, UA: 0.2 (ref 0.0–1.0)
pH: 7 (ref 5.0–8.0)

## 2019-10-15 LAB — LIPID PANEL
Cholesterol: 190 mg/dL (ref 0–200)
HDL: 65.8 mg/dL (ref >39.00)
LDL Cholesterol: 101 mg/dL — ABNORMAL HIGH (ref 0–99)
NonHDL: 124.56
Total CHOL/HDL Ratio: 3
Triglycerides: 116 mg/dL (ref 0.0–149.0)
VLDL: 23.2 mg/dL (ref 0.0–40.0)

## 2019-10-15 LAB — PSA: PSA: 2.16 ng/mL (ref 0.10–4.00)

## 2019-10-15 LAB — HEPATIC FUNCTION PANEL
ALT: 14 U/L (ref 0–53)
AST: 15 U/L (ref 0–37)
Albumin: 4.5 g/dL (ref 3.5–5.2)
Alkaline Phosphatase: 84 U/L (ref 39–117)
Bilirubin, Direct: 0.2 mg/dL (ref 0.0–0.3)
Total Bilirubin: 0.9 mg/dL (ref 0.2–1.2)
Total Protein: 7 g/dL (ref 6.0–8.3)

## 2019-10-15 LAB — TSH: TSH: 3.03 u[IU]/mL (ref 0.35–4.50)

## 2019-10-20 ENCOUNTER — Other Ambulatory Visit: Payer: Self-pay

## 2019-10-20 ENCOUNTER — Ambulatory Visit (INDEPENDENT_AMBULATORY_CARE_PROVIDER_SITE_OTHER): Payer: 59 | Admitting: Internal Medicine

## 2019-10-20 ENCOUNTER — Encounter: Payer: Self-pay | Admitting: Internal Medicine

## 2019-10-20 VITALS — BP 132/84 | HR 69 | Temp 98.0°F | Ht 68.0 in | Wt 187.0 lb

## 2019-10-20 DIAGNOSIS — Z23 Encounter for immunization: Secondary | ICD-10-CM

## 2019-10-20 DIAGNOSIS — E785 Hyperlipidemia, unspecified: Secondary | ICD-10-CM

## 2019-10-20 DIAGNOSIS — R011 Cardiac murmur, unspecified: Secondary | ICD-10-CM | POA: Diagnosis not present

## 2019-10-20 DIAGNOSIS — I10 Essential (primary) hypertension: Secondary | ICD-10-CM

## 2019-10-20 DIAGNOSIS — S93409D Sprain of unspecified ligament of unspecified ankle, subsequent encounter: Secondary | ICD-10-CM | POA: Diagnosis not present

## 2019-10-20 DIAGNOSIS — Z0000 Encounter for general adult medical examination without abnormal findings: Secondary | ICD-10-CM

## 2019-10-20 DIAGNOSIS — Z0001 Encounter for general adult medical examination with abnormal findings: Secondary | ICD-10-CM | POA: Diagnosis not present

## 2019-10-20 DIAGNOSIS — S93409A Sprain of unspecified ligament of unspecified ankle, initial encounter: Secondary | ICD-10-CM

## 2019-10-20 MED ORDER — ATORVASTATIN CALCIUM 10 MG PO TABS: ORAL_TABLET | ORAL | 3 refills | Status: DC

## 2019-10-20 MED ORDER — AMLODIPINE BESYLATE 5 MG PO TABS: 5.0000 mg | ORAL_TABLET | Freq: Every day | ORAL | 3 refills | Status: DC

## 2019-10-20 MED ORDER — OLMESARTAN MEDOXOMIL 40 MG PO TABS: 40.0000 mg | ORAL_TABLET | Freq: Every day | ORAL | 3 refills | Status: DC

## 2019-10-20 NOTE — Assessment & Plan Note (Addendum)
Worse LDL A  cardiac CT scan for calcium scoring offered Lipitor

## 2019-10-20 NOTE — Assessment & Plan Note (Addendum)
R - better ACE wrap

## 2019-10-20 NOTE — Progress Notes (Signed)
Subjective:  Patient ID: Steven Carpenter, male    DOB: [birth year]  Age: 63 y.o. MRN: [id number]  CC: No chief complaint on file.   HPI Steven Carpenter presents for a well exam C/o R ankle sprain 2 wks ago F/u murmur - had an ECHO 7 y ago  Outpatient Medications Prior to Visit  Medication Sig Dispense Refill  . amLODipine (NORVASC) 5 MG tablet Take 1 tablet (5 mg total) by mouth daily. 90 tablet 3  . amLODipine-olmesartan (AZOR) 5-40 MG tablet TAKE 1 TABLET DAILY MAKE ANAPPOINTMENT FOR FURTHER    REFILLS 90 tablet 3  . aspirin 81 MG tablet Take 81 mg by mouth daily.      [name] atorvastatin (LIPITOR) 10 MG tablet Take 1 tablet (10 mg total) by mouth daily. 90 tablet 3  . atorvastatin (LIPITOR) 10 MG tablet TAKE 1 TABLET DAILY MAKE ANAPPOINTMENT FOR FURTHER    REFILLS 90 tablet 1  . atorvastatin (LIPITOR) 10 MG tablet TAKE 1 TABLET DAILY MAKE ANAPPOINTMENT FOR FURTHER    REFILLS 90 tablet 3  . beta carotene w/minerals (OCUVITE) tablet Take 1 tablet by mouth daily.    . Cholecalciferol 1000 UNITS tablet Take 1,000 Units by mouth daily.      . fish oil-omega-3 fatty acids 1000 MG capsule Take 2 g by mouth daily.    . Multiple Vitamin (MULTIVITAMIN) tablet Take 1 tablet by mouth daily.    [name] olmesartan (BENICAR) 40 MG tablet Take 1 tablet (40 mg total) by mouth daily. 90 tablet 3   No facility-administered medications prior to visit.     ROS: Review of Systems  Constitutional: Negative for appetite change, fatigue and unexpected weight change.  HENT: Negative for congestion, nosebleeds, sneezing, sore throat and trouble swallowing.   Eyes: Negative for itching and visual disturbance.  Respiratory: Negative for cough.   Cardiovascular: Negative for chest pain, palpitations and leg swelling.  Gastrointestinal: Negative for abdominal distention, blood in stool, diarrhea and nausea.  Genitourinary: Negative for frequency and hematuria.  Musculoskeletal: Negative for back pain, gait problem, joint  swelling and neck pain.  Skin: Negative for rash.  Neurological: Negative for dizziness, tremors, speech difficulty and weakness.  Psychiatric/Behavioral: Negative for agitation, dysphoric mood and sleep disturbance. The patient is not nervous/anxious.     Objective:  BP 132/84 (BP Location: Left Arm, Patient Position: Sitting, Cuff Size: Normal)   Pulse 69   Temp 98 F (36.7 C) (Oral)   Ht 5\' 8"  (1.727 m)   Wt 187 lb (84.8 kg)   SpO2 96%   BMI 28.43 kg/m   BP Readings from Last 3 Encounters:  10/20/19 132/84  10/17/18 128/84  10/04/17 120/84    Wt Readings from Last 3 Encounters:  10/20/19 187 lb (84.8 kg)  10/17/18 183 lb (83 kg)  10/04/17 194 lb (88 kg)    Physical Exam Constitutional:      General: He is not in acute distress.    Appearance: He is well-developed.     Comments: NAD  Eyes:     Conjunctiva/sclera: Conjunctivae normal.     Pupils: Pupils are equal, round, and reactive to light.  Neck:     Musculoskeletal: Normal range of motion.     Thyroid: No thyromegaly.     Vascular: No JVD.  Cardiovascular:     Rate and Rhythm: Normal rate and regular rhythm.     Heart sounds: Murmur present. No friction rub. No gallop.   Pulmonary:  Effort: Pulmonary effort is normal. No respiratory distress.     Breath sounds: Normal breath sounds. No wheezing or rales.  Chest:     Chest wall: No tenderness.  Abdominal:     General: Bowel sounds are normal. There is no distension.     Palpations: Abdomen is soft. There is no mass.     Tenderness: There is no abdominal tenderness. There is no guarding or rebound.  Genitourinary:    Rectum: Normal. Guaiac result negative.  Musculoskeletal: Normal range of motion.        General: No tenderness.  Lymphadenopathy:     Cervical: No cervical adenopathy.  Skin:    General: Skin is warm and dry.     Findings: No rash.  Neurological:     Mental Status: He is alert and oriented to person, place, and time.     Cranial  Nerves: No cranial nerve deficit.     Motor: No abnormal muscle tone.     Coordination: Coordination normal.     Gait: Gait normal.     Deep Tendon Reflexes: Reflexes are normal and symmetric.  Psychiatric:        Behavior: Behavior normal.        Thought Content: Thought content normal.        Judgment: Judgment normal.   R ankle swollen, a little tender Prostate 1+  Procedure: EKG Indication: murmur Impression: s brady. No acute changes.    Lab Results  Component Value Date   WBC 6.5 10/15/2019   HGB 14.8 10/15/2019   HCT 44.0 10/15/2019   PLT 198.0 10/15/2019   GLUCOSE 96 10/15/2019   CHOL 190 10/15/2019   TRIG 116.0 10/15/2019   HDL 65.80 10/15/2019   LDLDIRECT 176.9 12/15/2013   LDLCALC 101 (H) 10/15/2019   ALT 14 10/15/2019   AST 15 10/15/2019   NA 139 10/15/2019   K 4.0 10/15/2019   CL 102 10/15/2019   CREATININE 0.83 10/15/2019   BUN 16 10/15/2019   CO2 29 10/15/2019   TSH 3.03 10/15/2019   PSA 2.16 10/15/2019    No results found.  Assessment & Plan:   There are no diagnoses linked to this encounter.   No orders of the defined types were placed in this encounter.    Follow-up: No follow-ups on file.  Walker Kehr, MD

## 2019-10-20 NOTE — Assessment & Plan Note (Signed)
A  cardiac CT scan for calcium scoring offered  We discussed age appropriate health related issues, including available/recomended screening tests and vaccinations. We discussed a need for adhering to healthy diet and exercise. Labs were ordered to be later reviewed . All questions were answered.

## 2019-10-20 NOTE — Assessment & Plan Note (Signed)
Chronic ECHO - repeat

## 2019-10-20 NOTE — Assessment & Plan Note (Signed)
Will break down Azor due to cost

## 2019-10-20 NOTE — Addendum Note (Signed)
Addended by: Karren Cobble on: 10/20/2019 08:56 AM   Modules accepted: Orders

## 2019-10-20 NOTE — Patient Instructions (Signed)

## 2020-05-11 ENCOUNTER — Encounter: Payer: Self-pay | Admitting: Internal Medicine

## 2020-05-25 ENCOUNTER — Telehealth: Payer: Self-pay | Admitting: Internal Medicine

## 2020-05-25 NOTE — Telephone Encounter (Signed)
New message:    Pt is calling and states he is now using [facility] mail order pharmacy. He states he needs a renewal of the medications sent to them. Please advise.

## 2020-05-26 MED ORDER — AMLODIPINE BESYLATE 5 MG PO TABS: 5.0000 mg | ORAL_TABLET | Freq: Every day | ORAL | 1 refills | Status: DC

## 2020-05-26 MED ORDER — ATORVASTATIN CALCIUM 10 MG PO TABS: 10.0000 mg | ORAL_TABLET | Freq: Every day | ORAL | 1 refills | Status: DC

## 2020-05-26 MED ORDER — OLMESARTAN MEDOXOMIL 40 MG PO TABS: 40.0000 mg | ORAL_TABLET | Freq: Every day | ORAL | 1 refills | Status: DC

## 2020-05-26 NOTE — Telephone Encounter (Signed)
Reviewed chart pt is up-to-date sent refills to [facility].Marland KitchenJohny Chess

## 2020-06-03 ENCOUNTER — Other Ambulatory Visit: Payer: Self-pay | Admitting: *Deleted

## 2020-06-04 ENCOUNTER — Telehealth: Payer: Self-pay | Admitting: Internal Medicine

## 2020-06-04 DIAGNOSIS — Z0000 Encounter for general adult medical examination without abnormal findings: Secondary | ICD-10-CM

## 2020-06-04 NOTE — Telephone Encounter (Signed)
    Patient calling to request order for labs prior to August appointment  Patient states he called Medicare and he is eligible for physical ( August)

## 2020-06-07 MED ORDER — ATORVASTATIN CALCIUM 10 MG PO TABS: 10.0000 mg | ORAL_TABLET | Freq: Every day | ORAL | 1 refills | Status: DC

## 2020-06-07 MED ORDER — OLMESARTAN MEDOXOMIL 40 MG PO TABS: 40.0000 mg | ORAL_TABLET | Freq: Every day | ORAL | 1 refills | Status: DC

## 2020-06-07 MED ORDER — AMLODIPINE BESYLATE 5 MG PO TABS: 5.0000 mg | ORAL_TABLET | Freq: Every day | ORAL | 1 refills | Status: DC

## 2020-06-07 NOTE — Telephone Encounter (Signed)
New Message:   Pt is calling and states we are needing to resend the new prescriptions for these medications to [facility] again. He also states a number to call them is [phone]. Please advise.

## 2020-06-07 NOTE — Addendum Note (Signed)
Addended by: Cresenciano Lick on: 06/07/2020 01:43 PM   Modules accepted: Orders

## 2020-06-07 NOTE — Telephone Encounter (Signed)
I called [facility] and they state Medicare states the patient has other coverage that should pay first. Medicare will not pay first.  The patient needs to update his coverage with the pharmacy. Pt informed of this and new updated rxs sent. See meds. Pt informed.

## 2020-06-11 NOTE — Telephone Encounter (Signed)
Left detailed message informing pt of below.  

## 2020-06-11 NOTE — Telephone Encounter (Signed)
Okay.  Thanks.

## 2020-06-15 ENCOUNTER — Ambulatory Visit (HOSPITAL_COMMUNITY): Payer: Medicare Other | Attending: Cardiology

## 2020-06-15 ENCOUNTER — Other Ambulatory Visit: Payer: Self-pay

## 2020-06-15 DIAGNOSIS — R011 Cardiac murmur, unspecified: Secondary | ICD-10-CM | POA: Diagnosis not present

## 2020-07-05 ENCOUNTER — Other Ambulatory Visit: Payer: Self-pay

## 2020-07-05 ENCOUNTER — Ambulatory Visit (AMBULATORY_SURGERY_CENTER): Payer: Self-pay

## 2020-07-05 VITALS — Ht 68.0 in | Wt 183.0 lb

## 2020-07-05 DIAGNOSIS — Z86010 Personal history of colonic polyps: Secondary | ICD-10-CM

## 2020-07-05 MED ORDER — SUTAB 1479-225-188 MG PO TABS: 1.0000 | ORAL_TABLET | ORAL | 0 refills | Status: DC

## 2020-07-05 NOTE — Progress Notes (Signed)
No egg or soy allergy known to patient  No issues with past sedation with any surgeries  or procedures, no intubation problems  No diet pills per patient No home 02 use per patient  No blood thinners per patient  Pt denies issues with constipation  No A fib or A flutter   COVID 19 guidelines implemented in [place] today   COVID vaccine completed on 05/2020 per pt. Coupon given to patient at pre visit.  Due to the COVID-19 pandemic we are asking patients to follow these guidelines. Please only bring one care partner. Please be aware that your care partner may wait in the car in the parking lot or if they feel like they will be too hot to wait in the car, they may wait in the lobby on the 4th floor. All care partners are required to wear a mask the entire time (we do not have any that we can provide them), they need to practice social distancing, and we will do a Covid check for all patient's and care partners when you arrive. Also we will check their temperature and your temperature. If the care partner waits in their car they need to stay in the parking lot the entire time and we will call them on their cell phone when the patient is ready for discharge so they can bring the car to the front of the building. Also all patient's will need to wear a mask into building.

## 2020-07-14 ENCOUNTER — Ambulatory Visit (AMBULATORY_SURGERY_CENTER): Payer: Medicare Other | Admitting: Internal Medicine

## 2020-07-14 ENCOUNTER — Encounter: Payer: Self-pay | Admitting: Internal Medicine

## 2020-07-14 VITALS — BP 103/74 | HR 58 | Temp 98.0°F | Resp 22 | Ht 68.0 in | Wt 183.0 lb

## 2020-07-14 DIAGNOSIS — Z86010 Personal history of colonic polyps: Secondary | ICD-10-CM

## 2020-07-14 MED ORDER — FLEET ENEMA 7-19 GM/118ML RE ENEM
1.0000 | ENEMA | Freq: Once | RECTAL | Status: AC
  Administered 2020-07-14: 1 via RECTAL

## 2020-07-14 MED ORDER — SODIUM CHLORIDE 0.9 % IV SOLN: 500.0000 mL | Freq: Once | INTRAVENOUS | Status: DC

## 2020-07-14 NOTE — Op Note (Signed)
[place] Patient Name: Steven Carpenter Procedure Date: 07/14/2020 8:36 AM MRN: [id number] Endoscopist: Gatha Mayer , MD Age: 64 Referring MD:  Date of Birth: 01/15/1956 Gender: Male Account #: [id number] Procedure:                Colonoscopy Indications:              Surveillance: Personal history of adenomatous                            polyps on last colonoscopy 3 years ago Medicines:                Propofol per Anesthesia, Monitored Anesthesia Care Procedure:                Pre-Anesthesia Assessment:                           - Prior to the procedure, a History and Physical                            was performed, and patient medications and                            allergies were reviewed. The patient's tolerance of                            previous anesthesia was also reviewed. The risks                            and benefits of the procedure and the sedation                            options and risks were discussed with the patient.                            All questions were answered, and informed consent                            was obtained. Prior Anticoagulants: The patient has                            taken no previous anticoagulant or antiplatelet                            agents. ASA Grade Assessment: II - A patient with                            mild systemic disease. After reviewing the risks                            and benefits, the patient was deemed in                            satisfactory condition to undergo the procedure.  After obtaining informed consent, the colonoscope                            was passed under direct vision. Throughout the                            procedure, the patient's blood pressure, pulse, and                            oxygen saturations were monitored continuously. The                            Colonoscope was introduced through the anus and                             advanced to the the cecum, identified by                            appendiceal orifice and ileocecal valve. The                            colonoscopy was performed without difficulty. The                            patient tolerated the procedure well. The quality                            of the bowel preparation was good. The appendiceal                            orifice and the rectum were photographed. The bowel                            preparation used was SuTab via split dose                            instruction. Tap water enema also given in                            admitting to clear sediment Scope In: 8:50:40 AM Scope Out: 9:04:33 AM Scope Withdrawal Time: 0 hours 10 minutes 57 seconds  Total Procedure Duration: 0 hours 13 minutes 53 seconds  Findings:                 The perianal and digital rectal examinations were                            normal. Pertinent negatives include normal prostate                            (size, shape, and consistency).                           External and internal hemorrhoids were found.  The exam was otherwise without abnormality on                            direct and retroflexion views. Complications:            No immediate complications. Estimated Blood Loss:     Estimated blood loss: none. Impression:               - External and internal hemorrhoids.                           - The examination was otherwise normal on direct                            and retroflexion views.                           - No specimens collected.                           - Personal history of colonic polyp - 10 mm adenoma                            2018. Recommendation:           - Patient has a contact number available for                            emergencies. The signs and symptoms of potential                            delayed complications were discussed with the                            patient. Return to  normal activities tomorrow.                            Written discharge instructions were provided to the                            patient.                           - Resume previous diet.                           - Continue present medications.                           - Repeat colonoscopy in 5 years for surveillance. Gatha Mayer, MD 07/14/2020 9:13:41 AM This report has been signed electronically.

## 2020-07-14 NOTE — Patient Instructions (Addendum)
No polyps today!  Your next routine colonoscopy should be in 5 years - 2026.  I appreciate the opportunity to care for you. Gatha Mayer, MD, [facility]  Please read all of the handouts given to you by your recovery room nurse. YOU HAD AN ENDOSCOPIC PROCEDURE TODAY AT [facility]:   Refer to the procedure report that was given to you for any specific questions about what was found during the examination.  If the procedure report does not answer your questions, please call your gastroenterologist to clarify.  If you requested that your care partner not be given the details of your procedure findings, then the procedure report has been included in a sealed envelope for you to review at your convenience later.  YOU SHOULD EXPECT: Some feelings of bloating in the abdomen. Passage of more gas than usual.  Walking can help get rid of the air that was put into your GI tract during the procedure and reduce the bloating. If you had a lower endoscopy (such as a colonoscopy or flexible sigmoidoscopy) you may notice spotting of blood in your stool or on the toilet paper. If you underwent a bowel prep for your procedure, you may not have a normal bowel movement for a few days.  Please Note:  You might notice some irritation and congestion in your nose or some drainage.  This is from the oxygen used during your procedure.  There is no need for concern and it should clear up in a day or so.  SYMPTOMS TO REPORT IMMEDIATELY:   Following lower endoscopy (colonoscopy or flexible sigmoidoscopy):  Excessive amounts of blood in the stool  Significant tenderness or worsening of abdominal pains  Swelling of the abdomen that is new, acute  Fever of 100F or higher   For urgent or emergent issues, a gastroenterologist can be reached at any hour by calling [phone]. Do not use MyChart messaging for urgent concerns.    DIET:  We do recommend a small meal at first, but then you may proceed to your  regular diet.  Drink plenty of fluids but you should avoid alcoholic beverages for 24 hours.  ACTIVITY:  You should plan to take it easy for the rest of today and you should NOT DRIVE or use heavy machinery until tomorrow (because of the sedation medicines used during the test).    FOLLOW UP: Our staff will call the number listed on your records 48-72 hours following your procedure to check on you and address any questions or concerns that you may have regarding the information given to you following your procedure. If we do not reach you, we will leave a message.  We will attempt to reach you two times.  During this call, we will ask if you have developed any symptoms of COVID 19. If you develop any symptoms (ie: fever, flu-like symptoms, shortness of breath, cough etc.) before then, please call [phone].  If you test positive for Covid 19 in the 2 weeks post procedure, please call and report this information to [place].     SIGNATURES/CONFIDENTIALITY: You and/or your care partner have signed paperwork which will be entered into your electronic medical record.  These signatures attest to the fact that that the information above on your After Visit Summary has been reviewed and is understood.  Full responsibility of the confidentiality of this discharge information lies with you and/or your care-partner.

## 2020-07-14 NOTE — Progress Notes (Signed)
Patient ID: Steven Carpenter, male   DOB: 18-Feb-1956, 64 y.o.   MRN: [id number] After enema, pt stool is yellow with some sediment at the bottom of the toilet. Dr Carlean Purl is aware and there are no further orders at this time. Pt able to ambulate back to stretcher to await procedure.

## 2020-07-14 NOTE — Progress Notes (Signed)
Report given to PACU, vss 

## 2020-07-14 NOTE — Progress Notes (Signed)
Patient ID: Steven Carpenter, male   DOB: May 20, 1956, 64 y.o.   MRN: [id number] Dr Carlean Purl made aware of pt stool color and consistency. Enema ordered and administered (see MAR), will continue to monitor pt.

## 2020-07-14 NOTE — Progress Notes (Signed)
Patient ID: Steven Carpenter, male   DOB: Jul 19, 1956, 64 y.o.   MRN: [id number] Emesis basin provided to pt for glasses during procedure. Pt label on basin

## 2020-08-05 ENCOUNTER — Other Ambulatory Visit (INDEPENDENT_AMBULATORY_CARE_PROVIDER_SITE_OTHER): Payer: Medicare Other

## 2020-08-05 DIAGNOSIS — Z0000 Encounter for general adult medical examination without abnormal findings: Secondary | ICD-10-CM | POA: Diagnosis not present

## 2020-08-05 LAB — LIPID PANEL
Cholesterol: 176 mg/dL (ref 0–200)
HDL: 76.6 mg/dL (ref >39.00)
LDL Cholesterol: 86 mg/dL (ref 0–99)
NonHDL: 99.52
Total CHOL/HDL Ratio: 2
Triglycerides: 67 mg/dL (ref 0.0–149.0)
VLDL: 13.4 mg/dL (ref 0.0–40.0)

## 2020-08-05 LAB — URINALYSIS
Bilirubin Urine: NEGATIVE
Hgb urine dipstick: NEGATIVE
Ketones, ur: NEGATIVE
Leukocytes,Ua: NEGATIVE
Nitrite: NEGATIVE
Specific Gravity, Urine: 1.025 (ref 1.000–1.030)
Total Protein, Urine: NEGATIVE
Urine Glucose: NEGATIVE
Urobilinogen, UA: 0.2 (ref 0.0–1.0)
pH: 5.5 (ref 5.0–8.0)

## 2020-08-05 LAB — TSH: TSH: 3.21 u[IU]/mL (ref 0.35–4.50)

## 2020-08-05 LAB — BASIC METABOLIC PANEL
BUN: 17 mg/dL (ref 6–23)
CO2: 30 mEq/L (ref 19–32)
Calcium: 9 mg/dL (ref 8.4–10.5)
Chloride: 104 mEq/L (ref 96–112)
Creatinine, Ser: 0.77 mg/dL (ref 0.40–1.50)
GFR: 101.22 mL/min (ref >60.00)
Glucose, Bld: 90 mg/dL (ref 70–99)
Potassium: 3.8 mEq/L (ref 3.5–5.1)
Sodium: 140 mEq/L (ref 135–145)

## 2020-08-05 LAB — CBC WITH DIFFERENTIAL/PLATELET
Basophils Absolute: 0.1 10*3/uL (ref 0.0–0.1)
Basophils Relative: 0.9 % (ref 0.0–3.0)
Eosinophils Absolute: 0.3 10*3/uL (ref 0.0–0.7)
Eosinophils Relative: 5.2 % — ABNORMAL HIGH (ref 0.0–5.0)
HCT: 42.4 % (ref 39.0–52.0)
Hemoglobin: 14.3 g/dL (ref 13.0–17.0)
Lymphocytes Relative: 27.9 % (ref 12.0–46.0)
Lymphs Abs: 1.7 10*3/uL (ref 0.7–4.0)
MCHC: 33.7 g/dL (ref 30.0–36.0)
MCV: 87.9 fl (ref 78.0–100.0)
Monocytes Absolute: 0.4 10*3/uL (ref 0.1–1.0)
Monocytes Relative: 7.2 % (ref 3.0–12.0)
Neutro Abs: 3.5 10*3/uL (ref 1.4–7.7)
Neutrophils Relative %: 58.8 % (ref 43.0–77.0)
Platelets: 180 10*3/uL (ref 150.0–400.0)
RBC: 4.82 Mil/uL (ref 4.22–5.81)
RDW: 14.8 % (ref 11.5–15.5)
WBC: 6 10*3/uL (ref 4.0–10.5)

## 2020-08-05 LAB — HEPATIC FUNCTION PANEL
ALT: 13 U/L (ref 0–53)
AST: 13 U/L (ref 0–37)
Albumin: 4.3 g/dL (ref 3.5–5.2)
Alkaline Phosphatase: 86 U/L (ref 39–117)
Bilirubin, Direct: 0.1 mg/dL (ref 0.0–0.3)
Total Bilirubin: 0.6 mg/dL (ref 0.2–1.2)
Total Protein: 6.9 g/dL (ref 6.0–8.3)

## 2020-08-05 LAB — PSA: PSA: 2.15 ng/mL (ref 0.10–4.00)

## 2020-08-10 ENCOUNTER — Encounter: Payer: Self-pay | Admitting: Internal Medicine

## 2020-08-10 ENCOUNTER — Ambulatory Visit (INDEPENDENT_AMBULATORY_CARE_PROVIDER_SITE_OTHER): Payer: Medicare Other | Admitting: Internal Medicine

## 2020-08-10 ENCOUNTER — Other Ambulatory Visit: Payer: Self-pay

## 2020-08-10 VITALS — BP 140/80 | HR 56 | Temp 98.2°F | Ht 68.0 in | Wt 187.0 lb

## 2020-08-10 DIAGNOSIS — E785 Hyperlipidemia, unspecified: Secondary | ICD-10-CM

## 2020-08-10 DIAGNOSIS — H6123 Impacted cerumen, bilateral: Secondary | ICD-10-CM | POA: Diagnosis not present

## 2020-08-10 DIAGNOSIS — H6120 Impacted cerumen, unspecified ear: Secondary | ICD-10-CM | POA: Insufficient documentation

## 2020-08-10 DIAGNOSIS — I10 Essential (primary) hypertension: Secondary | ICD-10-CM

## 2020-08-10 DIAGNOSIS — Z0000 Encounter for general adult medical examination without abnormal findings: Secondary | ICD-10-CM | POA: Diagnosis not present

## 2020-08-10 DIAGNOSIS — Z23 Encounter for immunization: Secondary | ICD-10-CM

## 2020-08-10 DIAGNOSIS — M25561 Pain in right knee: Secondary | ICD-10-CM

## 2020-08-10 DIAGNOSIS — G8929 Other chronic pain: Secondary | ICD-10-CM

## 2020-08-10 MED ORDER — VALSARTAN 320 MG PO TABS
320.0000 mg | ORAL_TABLET | Freq: Every day | ORAL | 3 refills | Status: DC
Start: 2020-08-10 — End: 2020-11-03

## 2020-08-10 MED ORDER — AMLODIPINE BESYLATE 5 MG PO TABS: 10.0000 mg | ORAL_TABLET | Freq: Every day | ORAL | 3 refills | Status: DC

## 2020-08-10 MED ORDER — ATORVASTATIN CALCIUM 10 MG PO TABS: 10.0000 mg | ORAL_TABLET | Freq: Every day | ORAL | 3 refills | Status: DC

## 2020-08-10 NOTE — Patient Instructions (Signed)

## 2020-08-10 NOTE — Assessment & Plan Note (Signed)
Lipitor 

## 2020-08-10 NOTE — Assessment & Plan Note (Signed)
  We discussed age appropriate health related issues, including available/recomended screening tests and vaccinations. Labs were ordered to be later reviewed . All questions were answered. We discussed one or more of the following - seat belt use, use of sunscreen/sun exposure exercise, safe sex, fall risk reduction, second hand smoke exposure, firearm use and storage, seat belt use, a need for adhering to healthy diet and exercise. Labs were ordered or discussed if they are available. All questions were answered.

## 2020-08-10 NOTE — Progress Notes (Signed)
Subjective:  Patient ID: Steven Carpenter, male    DOB: Dec 26, 1955  Age: 64 y.o. MRN: [id number]  CC: No chief complaint on file.   HPI Steven Carpenter presents for HTN, dyslipidemia C/o SBP 150s, pt wants to change his Rx C/o R knee pain  Outpatient Medications Prior to Visit  Medication Sig Dispense Refill  . amLODipine (NORVASC) 5 MG tablet Take 1 tablet (5 mg total) by mouth daily. Annual appt due in Oct must see provider for future refills 90 tablet 1  . aspirin 81 MG tablet Take 81 mg by mouth daily.      [name] atorvastatin (LIPITOR) 10 MG tablet Take 1 tablet (10 mg total) by mouth daily. Annual appt due in Oct must see provider for future refills 90 tablet 1  . olmesartan (BENICAR) 40 MG tablet Take 1 tablet (40 mg total) by mouth daily. Annual appt due in Oct must see provider for future refills 90 tablet 1   No facility-administered medications prior to visit.    ROS: Review of Systems  Constitutional: Negative for appetite change, fatigue and unexpected weight change.  HENT: Negative for congestion, nosebleeds, sneezing, sore throat and trouble swallowing.   Eyes: Negative for itching and visual disturbance.  Respiratory: Negative for cough.   Cardiovascular: Negative for chest pain, palpitations and leg swelling.  Gastrointestinal: Negative for abdominal distention, blood in stool, diarrhea and nausea.  Genitourinary: Negative for frequency and hematuria.  Musculoskeletal: Positive for arthralgias. Negative for back pain, gait problem, joint swelling and neck pain.  Skin: Negative for rash.  Neurological: Negative for dizziness, tremors, speech difficulty and weakness.  Psychiatric/Behavioral: Negative for agitation, dysphoric mood, sleep disturbance and suicidal ideas. The patient is not nervous/anxious.     Objective:  BP 140/80 (BP Location: Left Arm, Patient Position: Sitting, Cuff Size: Large)   Pulse (!) 56   Temp 98.2 F (36.8 C) (Oral)   Ht 5\' 8"  (1.727 m)   Wt 187  lb (84.8 kg)   SpO2 99%   BMI 28.43 kg/m   BP Readings from Last 3 Encounters:  08/10/20 140/80  07/14/20 103/74  10/20/19 132/84    Wt Readings from Last 3 Encounters:  08/10/20 187 lb (84.8 kg)  07/14/20 183 lb (83 kg)  07/05/20 183 lb (83 kg)    Physical Exam Constitutional:      General: He is not in acute distress.    Appearance: He is well-developed.     Comments: NAD  HENT:     Right Ear: There is impacted cerumen.     Left Ear: There is impacted cerumen.  Eyes:     Conjunctiva/sclera: Conjunctivae normal.     Pupils: Pupils are equal, round, and reactive to light.  Neck:     Thyroid: No thyromegaly.     Vascular: No JVD.  Cardiovascular:     Rate and Rhythm: Normal rate and regular rhythm.     Heart sounds: Normal heart sounds. No murmur heard.  No friction rub. No gallop.   Pulmonary:     Effort: Pulmonary effort is normal. No respiratory distress.     Breath sounds: Normal breath sounds. No wheezing or rales.  Chest:     Chest wall: No tenderness.  Abdominal:     General: Bowel sounds are normal. There is no distension.     Palpations: Abdomen is soft. There is no mass.     Tenderness: There is no abdominal tenderness. There is no guarding or rebound.  Musculoskeletal:  General: Tenderness present. Normal range of motion.     Cervical back: Normal range of motion.  Lymphadenopathy:     Cervical: No cervical adenopathy.  Skin:    General: Skin is warm and dry.     Findings: No rash.  Neurological:     Mental Status: He is alert and oriented to person, place, and time.     Cranial Nerves: No cranial nerve deficit.     Motor: No abnormal muscle tone.     Coordination: Coordination normal.     Gait: Gait normal.     Deep Tendon Reflexes: Reflexes are normal and symmetric.  Psychiatric:        Behavior: Behavior normal.        Thought Content: Thought content normal.        Judgment: Judgment normal.      Recent rectal - per GI R knee -  pain w/ROM   Procedure Note :     Procedure :  Ear irrigation right and left ears   Indication:  Cerumen impaction right and left ears   Risks, including pain, dizziness, eardrum perforation, bleeding, infection and others as well as benefits were explained to the patient in detail. Verbal consent was obtained and the patient agreed to proceed.    We used "The Elephant Ear Irrigation Device" filled with lukewarm water for irrigation. A large amount wax was recovered from both ears. Procedure has also required manual wax removal/instrumentation with an ear wax curette and ear forceps on the right and left ears.   Tolerated well. Complications: None.   Postprocedure instructions :  Call if problems.    Lab Results  Component Value Date   WBC 6.0 08/05/2020   HGB 14.3 08/05/2020   HCT 42.4 08/05/2020   PLT 180.0 08/05/2020   GLUCOSE 90 08/05/2020   CHOL 176 08/05/2020   TRIG 67.0 08/05/2020   HDL 76.60 08/05/2020   LDLDIRECT 176.9 12/15/2013   LDLCALC 86 08/05/2020   ALT 13 08/05/2020   AST 13 08/05/2020   NA 140 08/05/2020   K 3.8 08/05/2020   CL 104 08/05/2020   CREATININE 0.77 08/05/2020   BUN 17 08/05/2020   CO2 30 08/05/2020   TSH 3.21 08/05/2020   PSA 2.15 08/05/2020    No results found.  Assessment & Plan:     Follow-up: No follow-ups on file.  Walker Kehr, MD

## 2020-08-10 NOTE — Assessment & Plan Note (Signed)
Diovan and Amlodipine

## 2020-08-10 NOTE — Assessment & Plan Note (Signed)
See procedure 

## 2020-08-10 NOTE — Assessment & Plan Note (Signed)
Sports med

## 2020-08-16 ENCOUNTER — Ambulatory Visit (INDEPENDENT_AMBULATORY_CARE_PROVIDER_SITE_OTHER): Payer: Medicare Other

## 2020-08-16 ENCOUNTER — Ambulatory Visit: Payer: Self-pay

## 2020-08-16 ENCOUNTER — Encounter: Payer: Self-pay | Admitting: Family Medicine

## 2020-08-16 ENCOUNTER — Ambulatory Visit (INDEPENDENT_AMBULATORY_CARE_PROVIDER_SITE_OTHER): Payer: Medicare Other | Admitting: Family Medicine

## 2020-08-16 ENCOUNTER — Other Ambulatory Visit: Payer: Self-pay

## 2020-08-16 VITALS — BP 148/98 | HR 74 | Ht 68.0 in | Wt 185.6 lb

## 2020-08-16 DIAGNOSIS — M25561 Pain in right knee: Secondary | ICD-10-CM | POA: Diagnosis not present

## 2020-08-16 DIAGNOSIS — G8929 Other chronic pain: Secondary | ICD-10-CM | POA: Diagnosis not present

## 2020-08-16 DIAGNOSIS — M25571 Pain in right ankle and joints of right foot: Secondary | ICD-10-CM

## 2020-08-16 NOTE — Progress Notes (Signed)
Subjective:    CC: R knee pain  I, Steven Carpenter, LAT, ATC, am serving as scribe for Dr. Lynne Leader.  HPI: Pt is a 64 y/o male presenting w/ c/o R knee pain x one year w/ no known MOI.  He locates the pain to R ant knee . Patient is an avid IT trainer.  He notes pain is typically worse with waterskiing.  Pain is interfere with his ability to exercise normally as well.  Radiating pain: No R knee swelling: No R knee mechanical symptoms: yes Aggravating factors: hiking; water skiing; stairs Treatments tried: IBU  Also c/o R ankle pain after spraining it about a year ago.  It remains slightly swollen.  It is also a little painful.  Pertinent review of Systems: No fevers or chills  Relevant historical information: Hypertension   Objective:    Vitals:   08/16/20 1344  BP: (!) 148/98  Pulse: 74  SpO2: 98%   General: Well Developed, well nourished, and in no acute distress.   MSK: Right knee no effusion.  Decreased quadricep bulk Normal motion. Mildly tender palpation anterior knee with slight soft tissue swelling overlying patella and patellar tendon. Stable ligamentous exam. Intact strength Negative Murray's test.  Right ankle slightly swollen with some mild effusion medial and lateral.  Nontender normal ankle motion.  Stable ligamentous exam.  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  No fracture.  Right ankle: Significant callus formation around distal tibia.  Suspect previous ankle fracture last year.  Possible old fracture line present at corner of medial malleolus  Await formal radiology review  Diagnostic Limited MSK Ultrasound of: Right knee Quad tendon intact normal-appearing Small joint effusion superior patellar space. Patellar tendon intact normal-appearing Slight hypoechoic fluid tracking within the soft tissue  superficial to the patella and patellar tendon. Lateral joint line normal-appearing Medial joint line narrowed degenerative with extruded medial meniscus Posterior knee small Baker's cyst. Impression: Medial compartment DJD with small effusion and small Baker's cyst.  Possible mild prepatellar bursitis    Impression and Recommendations:    Assessment and Plan: 63 y.o. male with right knee pain.  Likely dealing with medial compartment DJD.  Discussed options.  Plan for quad strengthening and Voltaren gel.  Consider physical therapy and injection.  Ankle pain.  This was a bit of a add onto the main visit topic today.  He suffered a "ankle sprain" about a year ago.  On x-ray today per my read looks like he may have actually fractured his tibia at the ankle.  Fortunate looks like it is pretty preserved anatomy with good callus formation.  Await formal radiology review..   Orders Placed This Encounter  Procedures  . [place] LIMITED JOINT SPACE STRUCTURES LOW RIGHT(NO LINKED CHARGES)    Order Specific Question:   Reason for Exam (SYMPTOM  OR DIAGNOSIS REQUIRED)    Answer:   R knee pain    Order Specific Question:   Preferred imaging location?    Answer:   [facility]  . DG Knee AP/LAT W/[place] Right    Standing Status:   Future    Number of Occurrences:   1    Standing Expiration Date:   08/16/2021    Order Specific Question:   Reason for Exam (SYMPTOM  OR DIAGNOSIS REQUIRED)    Answer:   eval knee pain    Order  Specific Question:   Preferred imaging location?    Answer:   [facility]    Order Specific Question:   Radiology Contrast Protocol - do NOT remove file path    Answer:   \\[email or web address]\Radiant\DXFluoroContrastProtocols.pdf  . DG Ankle Complete Right    Standing Status:   Future    Number of Occurrences:   1    Standing Expiration Date:   08/16/2021    Order Specific Question:   Reason for Exam (SYMPTOM  OR DIAGNOSIS REQUIRED)    Answer:    ankle pain after sprain 1 year ago    Order Specific Question:   Preferred imaging location?    Answer:   [facility]    Order Specific Question:   Radiology Contrast Protocol - do NOT remove file path    Answer:   \\[email or web address]\[email or web address]\Radiant\DXFluoroContrastProtocols.pdf   No orders of the defined types were placed in this encounter.   Discussed warning signs or symptoms. Please see discharge instructions. Patient expresses understanding.   The above documentation has been reviewed and is accurate and complete Lynne Leader, M.D.

## 2020-08-16 NOTE — Patient Instructions (Signed)
Thank you for coming in today.  Plan for voltaren gel.  Continue the compression sleeve.  Add the leg raises toe up and out.  Add knee extension with ankle weight.  Do about 10-30 reps 2-3x daily.   Consider Physical therapy.   Return for injection as needed.   Get xray today on the way out.

## 2020-08-17 NOTE — Progress Notes (Signed)
X-ray shows medium arthritis in most of the knee and more significant arthritis at the inside part of the knee.

## 2020-08-17 NOTE — Progress Notes (Signed)
X-ray ankle shows fracture.  It looks like he probably broke your ankle a while ago.  Would recommend using a clamshell Aircast which can be purchased from [facility] [facility].  Additionally you could return to clinic and we can give you an Aircast for your ankle.  Recommend recheck the x-ray in about a month of ankle immobilization.

## 2020-09-20 ENCOUNTER — Encounter: Payer: 59 | Admitting: Internal Medicine

## 2020-11-03 ENCOUNTER — Other Ambulatory Visit: Payer: Self-pay | Admitting: *Deleted

## 2020-11-03 MED ORDER — ATORVASTATIN CALCIUM 10 MG PO TABS
10.0000 mg | ORAL_TABLET | Freq: Every day | ORAL | 2 refills | Status: DC
Start: 2020-11-03 — End: 2020-12-15

## 2020-11-03 MED ORDER — VALSARTAN 320 MG PO TABS
320.0000 mg | ORAL_TABLET | Freq: Every day | ORAL | 2 refills | Status: DC
Start: 2020-11-03 — End: 2020-12-15

## 2020-11-03 MED ORDER — AMLODIPINE BESYLATE 5 MG PO TABS: 10.0000 mg | ORAL_TABLET | Freq: Every day | ORAL | 2 refills | Status: DC

## 2020-11-04 ENCOUNTER — Telehealth: Payer: Self-pay | Admitting: Internal Medicine

## 2020-11-04 MED ORDER — AMLODIPINE BESYLATE 5 MG PO TABS: 10.0000 mg | ORAL_TABLET | Freq: Every day | ORAL | 2 refills | Status: DC

## 2020-11-04 NOTE — Telephone Encounter (Signed)
Rx was sent electronically yesterday. Resent to [facility].Marland KitchenJohny Carpenter

## 2020-11-04 NOTE — Telephone Encounter (Signed)
amLODipine (NORVASC) 5 MG tablet [facility] [place] [place] to [facility] [place] [facility] Phone:  [phone]  Fax:  [phone]     Patient states he got a call this morning from [facility] stating they never received anything from [place] for a refill for this medication.

## 2020-12-06 ENCOUNTER — Telehealth: Payer: Self-pay | Admitting: Internal Medicine

## 2020-12-06 MED ORDER — AMLODIPINE BESYLATE 5 MG PO TABS: 10.0000 mg | ORAL_TABLET | Freq: Every day | ORAL | 2 refills | Status: DC

## 2020-12-06 NOTE — Telephone Encounter (Signed)
    Pharmacy  Calling to request new order for amLODipine (NORVASC) 5 MG tablet Patient going out of town, requesting vacation override

## 2020-12-06 NOTE — Telephone Encounter (Signed)
rx sent to [facility].Marland KitchenLind Carpenter

## 2020-12-15 MED ORDER — ATORVASTATIN CALCIUM 10 MG PO TABS
10.0000 mg | ORAL_TABLET | Freq: Every day | ORAL | 2 refills | Status: DC
Start: 2020-12-15 — End: 2022-01-02

## 2020-12-15 MED ORDER — VALSARTAN 320 MG PO TABS
320.0000 mg | ORAL_TABLET | Freq: Every day | ORAL | 2 refills | Status: DC
Start: 2020-12-15 — End: 2022-01-19

## 2020-12-15 NOTE — Telephone Encounter (Signed)
[facility] [place].Marland KitchenJohny Carpenter

## 2020-12-15 NOTE — Addendum Note (Signed)
Addended by: Earnstine Regal on: 12/15/2020 01:55 PM   Modules accepted: Orders

## 2020-12-15 NOTE — Telephone Encounter (Signed)
   Scott from [facility] calling to request order for 90 day supply be sent to local pharmacy. Patient going out of town.  Please send atorvastatin (LIPITOR) 10 MG tablet and valsartan (DIOVAN) 320 MG tablet to [id number] [place], [place] - [address]

## 2020-12-30 ENCOUNTER — Other Ambulatory Visit: Payer: Self-pay

## 2020-12-30 ENCOUNTER — Ambulatory Visit (INDEPENDENT_AMBULATORY_CARE_PROVIDER_SITE_OTHER): Payer: Medicare Other | Admitting: Internal Medicine

## 2020-12-30 ENCOUNTER — Encounter: Payer: Self-pay | Admitting: Internal Medicine

## 2020-12-30 ENCOUNTER — Telehealth: Payer: Self-pay | Admitting: Internal Medicine

## 2020-12-30 VITALS — BP 144/72 | HR 68 | Temp 98.4°F | Ht 68.0 in | Wt 187.0 lb

## 2020-12-30 DIAGNOSIS — R109 Unspecified abdominal pain: Secondary | ICD-10-CM | POA: Diagnosis not present

## 2020-12-30 DIAGNOSIS — R1032 Left lower quadrant pain: Secondary | ICD-10-CM

## 2020-12-30 MED ORDER — HYDROCODONE-ACETAMINOPHEN 7.5-325 MG PO TABS: 1.0000 | ORAL_TABLET | Freq: Four times a day (QID) | ORAL | 0 refills | Status: DC | PRN

## 2020-12-30 MED ORDER — TAMSULOSIN HCL 0.4 MG PO CAPS: 0.4000 mg | ORAL_CAPSULE | Freq: Every day | ORAL | 0 refills | Status: DC

## 2020-12-30 MED ORDER — DIAZEPAM 5 MG PO TABS: ORAL_TABLET | ORAL | 0 refills | Status: DC

## 2020-12-30 NOTE — Patient Instructions (Signed)
I agree you likely have kidney stone trying to pass on the left side  Your blood test for the kidney function, and urine testing to look for blood is pending  Please take all new medication as prescribed - the pain medication if needed, the flomax to help the stone "move along", and valium 5 mg - just 1 pill to take 1 hour before the CT scan  You will be contacted regarding the referral for: CT scan for tomorrow  Please continue all other medications as before, and refills have been done if requested.  Please have the pharmacy call with any other refills you may need.  Please keep your appointments with your specialists as you may have planned  If you have a kidney stone AND obstruction - you would need a urology referral ASAP  If you have a kidney stone but no obstruction - you would need a urology appt, but not urgent  If there is no kidney stone, you would still need a urology appt relatively soon, as you may have passed the stone, but still need to find out if any other cause for the blood

## 2020-12-30 NOTE — Progress Notes (Signed)
Established Patient Office Visit  Subjective:  Patient ID: Steven Carpenter, male    DOB: 1956/02/18  Age: 65 y.o. MRN: [id number]         Chief Complaint: left flank and lower abd pain and gross hematuria       HPI:  Steven Carpenter is a 65 y.o. male here to c/o 4 days onset left flank then LLQ/groin area intermittent at time very severe pain sharp without other radiation, worse at night until yesterday for some reason worse during the day as well, associated with 1 episode of large amount gross hemturia.  No current pain now.  Did not want to go to ED or UC.  Denies fever but has had chills and sweats with onset of pain at times.  No prior hx of renal stones, no FH renal stones  Denies urinary symptoms such as dysuria, frequency, urgency, flank pain, or vomiting.   .        Wt Readings from Last 3 Encounters:  12/30/20 187 lb (84.8 kg)  08/16/20 185 lb 9.6 oz (84.2 kg)  08/10/20 187 lb (84.8 kg)   BP Readings from Last 3 Encounters:  12/30/20 (!) 144/72  08/16/20 (!) 148/98  08/10/20 140/80        Past Medical History:  Diagnosis Date  . Heart murmur    all of life  . Hx of adenomatous polyp of colon 02/01/2017  . Hyperlipidemia   . Hypertension    Past Surgical History:  Procedure Laterality Date  . APPENDECTOMY    . INGUINAL HERNIA REPAIR  08/06/12   bilateral    reports that he has never smoked. He has never used smokeless tobacco. He reports current alcohol use of about 1.0 - 2.0 standard drink of alcohol per week. He reports that he does not use drugs. family history includes Cancer in his mother; Hypertension in his father and mother. No Known Allergies Current Outpatient Medications on File Prior to Visit  Medication Sig Dispense Refill  . amLODipine (NORVASC) 5 MG tablet Take 2 tablets (10 mg total) by mouth daily. 180 tablet 2  . atorvastatin (LIPITOR) 10 MG tablet Take 1 tablet (10 mg total) by mouth daily. 90 tablet 2  . valsartan (DIOVAN) 320 MG tablet Take 1 tablet  (320 mg total) by mouth daily. 90 tablet 2   No current facility-administered medications on file prior to visit.        ROS:  All others reviewed and negative.  Objective        PE:  BP (!) 144/72 (BP Location: Left Arm, Patient Position: Sitting, Cuff Size: Normal)   Pulse 68   Temp 98.4 F (36.9 C) (Oral)   Ht 5\' 8"  (1.727 m)   Wt 187 lb (84.8 kg)   SpO2 99%   BMI 28.43 kg/m                 Constitutional: Pt appears in NAD               HENT: Head: NCAT.                Right Ear: External ear normal.                 Left Ear: External ear normal.                Eyes: . Pupils are equal, round, and reactive to light. Conjunctivae and EOM are normal  Nose: without d/c or deformity               Neck: Neck supple. Gross normal ROM               Cardiovascular: Normal rate and regular rhythm.                 Pulmonary/Chest: Effort normal and breath sounds without rales or wheezing.                Abd:  Soft, NT, ND, + BS, no organomegaly               Neurological: Pt is alert. At baseline orientation, motor grossly intact               Skin: Skin is warm. No rashes, no other new lesions, LE edema - none               Psychiatric: Pt behavior is normal without agitation   Assessment/Plan:  Steven Carpenter is a 65 y.o. White or Caucasian [1] male with  has a past medical history of Heart murmur, adenomatous polyp of colon (02/01/2017), Hyperlipidemia, and Hypertension.   Assessment Plan  See problem oriented assessment and plan Labs reviewed for each problem: Lab Results  Component Value Date   WBC 6.0 08/05/2020   HGB 14.3 08/05/2020   HCT 42.4 08/05/2020   PLT 180.0 08/05/2020   GLUCOSE 90 08/05/2020   CHOL 176 08/05/2020   TRIG 67.0 08/05/2020   HDL 76.60 08/05/2020   LDLDIRECT 176.9 12/15/2013   LDLCALC 86 08/05/2020   ALT 13 08/05/2020   AST 13 08/05/2020   NA 140 08/05/2020   K 3.8 08/05/2020   CL 104 08/05/2020   CREATININE 0.77 08/05/2020   BUN  17 08/05/2020   CO2 30 08/05/2020   TSH 3.21 08/05/2020   PSA 2.15 08/05/2020    Micro: none  Cardiac tracings I have personally interpreted today:  none  Pertinent Radiological findings (summarize): none    Health Maintenance Due  Topic Date Due  . COVID-19 Vaccine (1) Never done  . HIV Screening  Never done  . INFLUENZA VACCINE  07/25/2020    There are no preventive care reminders to display for this patient.  Lab Results  Component Value Date   TSH 3.21 08/05/2020   Lab Results  Component Value Date   WBC 6.0 08/05/2020   HGB 14.3 08/05/2020   HCT 42.4 08/05/2020   MCV 87.9 08/05/2020   PLT 180.0 08/05/2020   Lab Results  Component Value Date   NA 140 08/05/2020   K 3.8 08/05/2020   CO2 30 08/05/2020   GLUCOSE 90 08/05/2020   BUN 17 08/05/2020   CREATININE 0.77 08/05/2020   BILITOT 0.6 08/05/2020   ALKPHOS 86 08/05/2020   AST 13 08/05/2020   ALT 13 08/05/2020   PROT 6.9 08/05/2020   ALBUMIN 4.3 08/05/2020   CALCIUM 9.0 08/05/2020   GFR 101.22 08/05/2020   Lab Results  Component Value Date   CHOL 176 08/05/2020   Lab Results  Component Value Date   HDL 76.60 08/05/2020   Lab Results  Component Value Date   LDLCALC 86 08/05/2020   Lab Results  Component Value Date   TRIG 67.0 08/05/2020   Lab Results  Component Value Date   CHOLHDL 2 08/05/2020   No results found for: HGBA1C    Assessment & Plan:   Problem List Items Addressed This Visit  High   Left lower quadrant abdominal pain    With also episode gross hemuturia, will also need urology for hematuria if CT neg for stone possibly passed      Relevant Orders   CT ABDOMEN PELVIS WO CONTRAST   Flank pain - Primary    With high suspicion for left renal stone, for UA and BMP, hydrocodone prn, flomax daily, CT abd/pelvis for r/o stone with valium 5 mg x 1 prior to be scheduled tomorrow, and will need urgent urology if obstruction found, or routine referral and strain urine without  obstruction      Relevant Orders   Urinalysis, Routine w reflex microscopic   Basic metabolic panel      Meds ordered this encounter  Medications  . HYDROcodone-acetaminophen (NORCO) 7.5-325 MG tablet    Sig: Take 1 tablet by mouth every 6 (six) hours as needed for moderate pain.    Dispense:  20 tablet    Refill:  0  . diazepam (VALIUM) 5 MG tablet    Sig: 1 tablet by mouth about 1 hour prior to the procedure    Dispense:  1 tablet    Refill:  0  . tamsulosin (FLOMAX) 0.4 MG CAPS capsule    Sig: Take 1 capsule (0.4 mg total) by mouth daily.    Dispense:  30 capsule    Refill:  0    Follow-up: Return if symptoms worsen or fail to improve.   Oliver Barre, MD 12/30/2020 8:02 PM [facility]

## 2020-12-30 NOTE — Assessment & Plan Note (Signed)
With high suspicion for left renal stone, for UA and BMP, hydrocodone prn, flomax daily, CT abd/pelvis for r/o stone with valium 5 mg x 1 prior to be scheduled tomorrow, and will need urgent urology if obstruction found, or routine referral and strain urine without obstruction

## 2020-12-30 NOTE — Telephone Encounter (Signed)
I can see him at 2: 20 in place of [facility] Thx

## 2020-12-30 NOTE — Assessment & Plan Note (Signed)
With also episode gross hemuturia, will also need urology for hematuria if CT neg for stone possibly passed

## 2020-12-30 NOTE — Telephone Encounter (Signed)
Patient calling to see if there was anything he can do, patient has been experiencing some lower back pain and nausea and he thinks its a kidney stone, patient has an appointment tomorrow with Ria Clock but wanted to send a message to Dr. Posey Rea as well.

## 2020-12-30 NOTE — Telephone Encounter (Signed)
Called pt he states he could not e here by 2:20. He is seeing Dr. Jonny Ruiz this afternoon @ 4:00.Marland KitchenRaechel Chute

## 2020-12-31 ENCOUNTER — Encounter: Payer: Self-pay | Admitting: Internal Medicine

## 2020-12-31 ENCOUNTER — Ambulatory Visit
Admission: RE | Admit: 2020-12-31 | Discharge: 2020-12-31 | Disposition: A | Discharge status: Home / Self Care | Payer: Medicare Other | Source: Ambulatory Visit | Attending: Internal Medicine | Admitting: Internal Medicine

## 2020-12-31 ENCOUNTER — Other Ambulatory Visit: Payer: Self-pay | Admitting: Internal Medicine

## 2020-12-31 ENCOUNTER — Ambulatory Visit: Payer: Medicare Other | Admitting: Family

## 2020-12-31 DIAGNOSIS — N200 Calculus of kidney: Secondary | ICD-10-CM

## 2020-12-31 DIAGNOSIS — R1032 Left lower quadrant pain: Secondary | ICD-10-CM

## 2020-12-31 DIAGNOSIS — N1330 Unspecified hydronephrosis: Secondary | ICD-10-CM

## 2020-12-31 LAB — URINALYSIS, ROUTINE W REFLEX MICROSCOPIC
Bilirubin Urine: NEGATIVE
Ketones, ur: NEGATIVE
Leukocytes,Ua: NEGATIVE
Nitrite: NEGATIVE
Specific Gravity, Urine: <=1.005 — AB (ref 1.000–1.030)
Total Protein, Urine: NEGATIVE
Urine Glucose: NEGATIVE
Urobilinogen, UA: 0.2 (ref 0.0–1.0)
WBC, UA: NONE SEEN (ref 0–?)
pH: 6.5 (ref 5.0–8.0)

## 2020-12-31 LAB — BASIC METABOLIC PANEL
BUN: 15 mg/dL (ref 6–23)
CO2: 29 mEq/L (ref 19–32)
Calcium: 9.3 mg/dL (ref 8.4–10.5)
Chloride: 99 mEq/L (ref 96–112)
Creatinine, Ser: 0.96 mg/dL (ref 0.40–1.50)
GFR: 82.69 mL/min (ref >60.00)
Glucose, Bld: 100 mg/dL — ABNORMAL HIGH (ref 70–99)
Potassium: 4 mEq/L (ref 3.5–5.1)
Sodium: 135 mEq/L (ref 135–145)

## 2021-01-03 ENCOUNTER — Ambulatory Visit: Payer: Medicare Other | Admitting: Family

## 2021-01-03 ENCOUNTER — Other Ambulatory Visit: Payer: Medicare Other

## 2021-01-05 ENCOUNTER — Other Ambulatory Visit (HOSPITAL_COMMUNITY)
Admission: RE | Admit: 2021-01-05 | Discharge: 2021-01-05 | Disposition: A | Discharge status: Home / Self Care | Payer: Medicare Other | Source: Ambulatory Visit | Attending: Urology | Admitting: Urology

## 2021-01-05 ENCOUNTER — Other Ambulatory Visit: Payer: Self-pay | Admitting: Urology

## 2021-01-05 DIAGNOSIS — Z20822 Contact with and (suspected) exposure to covid-19: Secondary | ICD-10-CM | POA: Insufficient documentation

## 2021-01-05 DIAGNOSIS — Z01812 Encounter for preprocedural laboratory examination: Secondary | ICD-10-CM | POA: Insufficient documentation

## 2021-01-05 DIAGNOSIS — N201 Calculus of ureter: Secondary | ICD-10-CM

## 2021-01-05 LAB — SARS CORONAVIRUS 2 (TAT 6-24 HRS): SARS Coronavirus 2: NEGATIVE

## 2021-01-05 NOTE — Progress Notes (Signed)
Talked with patient arrival time 1200. Clear liquids until 1000 am. Instructions given, driver to be determined. Steven Carpenter will be with pt at night

## 2021-01-06 ENCOUNTER — Encounter (HOSPITAL_BASED_OUTPATIENT_CLINIC_OR_DEPARTMENT_OTHER): Payer: Self-pay | Admitting: Urology

## 2021-01-06 ENCOUNTER — Ambulatory Visit (HOSPITAL_BASED_OUTPATIENT_CLINIC_OR_DEPARTMENT_OTHER)
Admission: RE | Admit: 2021-01-06 | Discharge: 2021-01-06 | Disposition: A | Discharge status: Home / Self Care | Payer: Medicare Other | Attending: Urology | Admitting: Urology

## 2021-01-06 ENCOUNTER — Encounter (HOSPITAL_BASED_OUTPATIENT_CLINIC_OR_DEPARTMENT_OTHER)
Admission: RE | Disposition: A | Discharge status: Home / Self Care | Payer: Self-pay | Source: Home / Self Care | Attending: Urology

## 2021-01-06 ENCOUNTER — Ambulatory Visit (HOSPITAL_COMMUNITY): Payer: Medicare Other

## 2021-01-06 ENCOUNTER — Other Ambulatory Visit: Payer: Self-pay

## 2021-01-06 DIAGNOSIS — N201 Calculus of ureter: Secondary | ICD-10-CM

## 2021-01-06 DIAGNOSIS — I10 Essential (primary) hypertension: Secondary | ICD-10-CM | POA: Insufficient documentation

## 2021-01-06 DIAGNOSIS — K449 Diaphragmatic hernia without obstruction or gangrene: Secondary | ICD-10-CM | POA: Diagnosis not present

## 2021-01-06 DIAGNOSIS — N132 Hydronephrosis with renal and ureteral calculous obstruction: Secondary | ICD-10-CM | POA: Insufficient documentation

## 2021-01-06 HISTORY — PX: EXTRACORPOREAL SHOCK WAVE LITHOTRIPSY: SHX1557

## 2021-01-06 SURGERY — LITHOTRIPSY, ESWL
Anesthesia: LOCAL | Laterality: Left

## 2021-01-06 MED ORDER — CIPROFLOXACIN HCL 500 MG PO TABS
ORAL_TABLET | ORAL | Status: AC
  Filled 2021-01-06: qty 1

## 2021-01-06 MED ORDER — DIAZEPAM 5 MG PO TABS
10.0000 mg | ORAL_TABLET | ORAL | Status: AC
  Administered 2021-01-06: 10 mg via ORAL

## 2021-01-06 MED ORDER — ONDANSETRON HCL 4 MG PO TABS: 4.0000 mg | ORAL_TABLET | Freq: Every day | ORAL | 1 refills | Status: DC | PRN

## 2021-01-06 MED ORDER — DIPHENHYDRAMINE HCL 25 MG PO CAPS
ORAL_CAPSULE | ORAL | Status: AC
  Filled 2021-01-06: qty 1

## 2021-01-06 MED ORDER — PROMETHAZINE HCL 12.5 MG PO TABS: 12.5000 mg | ORAL_TABLET | Freq: Four times a day (QID) | ORAL | 1 refills | Status: DC | PRN

## 2021-01-06 MED ORDER — DIAZEPAM 5 MG PO TABS
ORAL_TABLET | ORAL | Status: AC
  Filled 2021-01-06: qty 2

## 2021-01-06 MED ORDER — SODIUM CHLORIDE 0.9 % IV SOLN: INTRAVENOUS | Status: DC

## 2021-01-06 MED ORDER — TAMSULOSIN HCL 0.4 MG PO CAPS
0.4000 mg | ORAL_CAPSULE | Freq: Every day | ORAL | 0 refills | Status: DC
Start: 2021-01-06 — End: 2021-06-16

## 2021-01-06 MED ORDER — DIPHENHYDRAMINE HCL 25 MG PO CAPS
25.0000 mg | ORAL_CAPSULE | ORAL | Status: AC
  Administered 2021-01-06: 25 mg via ORAL

## 2021-01-06 MED ORDER — CIPROFLOXACIN HCL 500 MG PO TABS
500.0000 mg | ORAL_TABLET | ORAL | Status: AC
  Administered 2021-01-06: 500 mg via ORAL

## 2021-01-06 NOTE — Discharge Instructions (Signed)

## 2021-01-06 NOTE — H&P (Signed)
See scanned H&P from [facility] 

## 2021-01-06 NOTE — Op Note (Signed)
[facility] Operative Note  Treating Physician: Ellison Hughs, MD  Pre-op diagnosis: 4 mm left mid-ureteral stone  Post-op diagnosis: Same   Procedure: LEFT ESWL  See Aris Everts OP note scanned into chart. Also because of the size, density, location and other factors that cannot be anticipated I feel this will likely be a staged procedure. This fact supersedes any indication in the scanned [place] [facility] operative note to the contrary

## 2021-01-07 ENCOUNTER — Encounter (HOSPITAL_BASED_OUTPATIENT_CLINIC_OR_DEPARTMENT_OTHER): Payer: Self-pay | Admitting: Urology

## 2021-01-10 ENCOUNTER — Encounter: Payer: Self-pay | Admitting: Internal Medicine

## 2021-01-10 ENCOUNTER — Telehealth: Payer: Self-pay

## 2021-01-10 NOTE — Telephone Encounter (Signed)
-----   Message from Biagio Borg, MD sent at 12/31/2020  3:06 PM EST ----- Staff to please inform pt, I will do referral

## 2021-01-10 NOTE — Telephone Encounter (Signed)
The test results show that your current treatment is OK, except there is the finding of a small but mildly obstructing left kidney stone. As we discussed we will need to refer you urgently to Urology for further consideration. Hopefully you will hear soon.  There is no other need for change of treatment or further evaluation based on these results, at this time.   Any other information above is not felt to be significant at this time, and can be reviewed at your next visit.   Please otherwise continue the same plan for further evaluation and treatment as discussed.  Also sent this note in a myChart message

## 2021-01-21 ENCOUNTER — Other Ambulatory Visit: Payer: Self-pay | Admitting: Internal Medicine

## 2021-01-21 NOTE — Telephone Encounter (Signed)
Please refill as per office routine med refill policy (all routine meds refilled for 3 mo or monthly per pt preference up to one year from last visit, then month to month grace period for 3 mo, then further med refills will have to be denied)  

## 2021-04-04 ENCOUNTER — Other Ambulatory Visit (HOSPITAL_COMMUNITY): Payer: Self-pay

## 2021-04-18 IMAGING — DX DG KNEE AP/LAT W/ SUNRISE*R*
3 series · 3 of 3 positions shown · non-contrast
Comparison: None.

CLINICAL DATA: Knee pain with swelling

EXAM:
RIGHT KNEE 3 VIEWS

[knee ap]
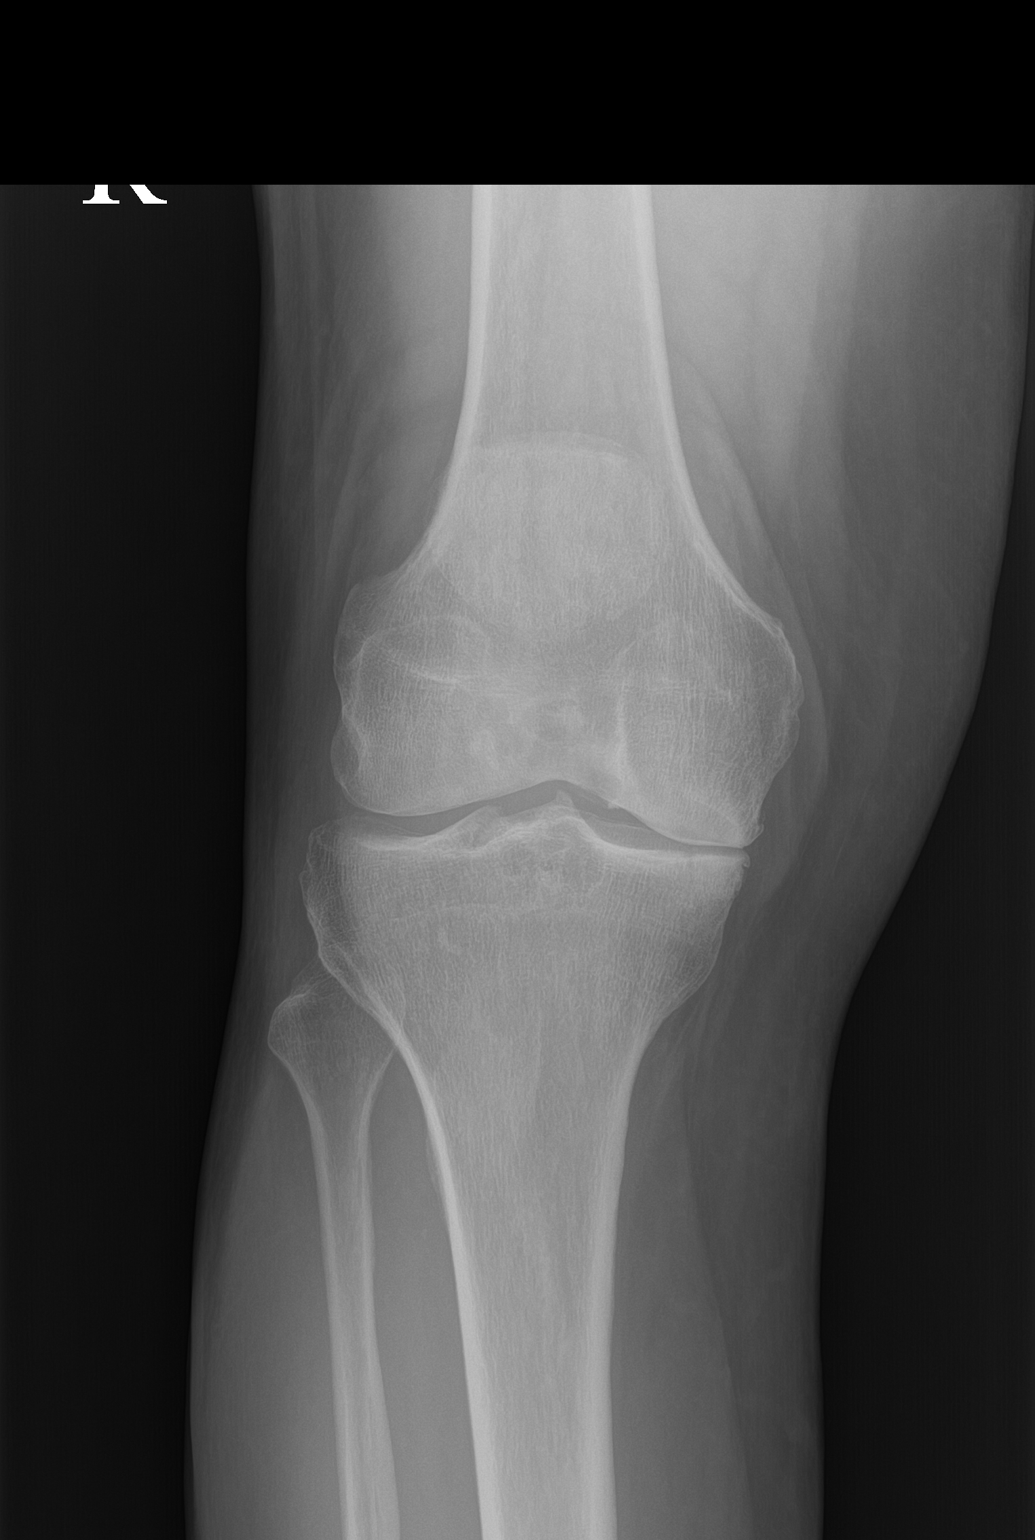

[knee lat]
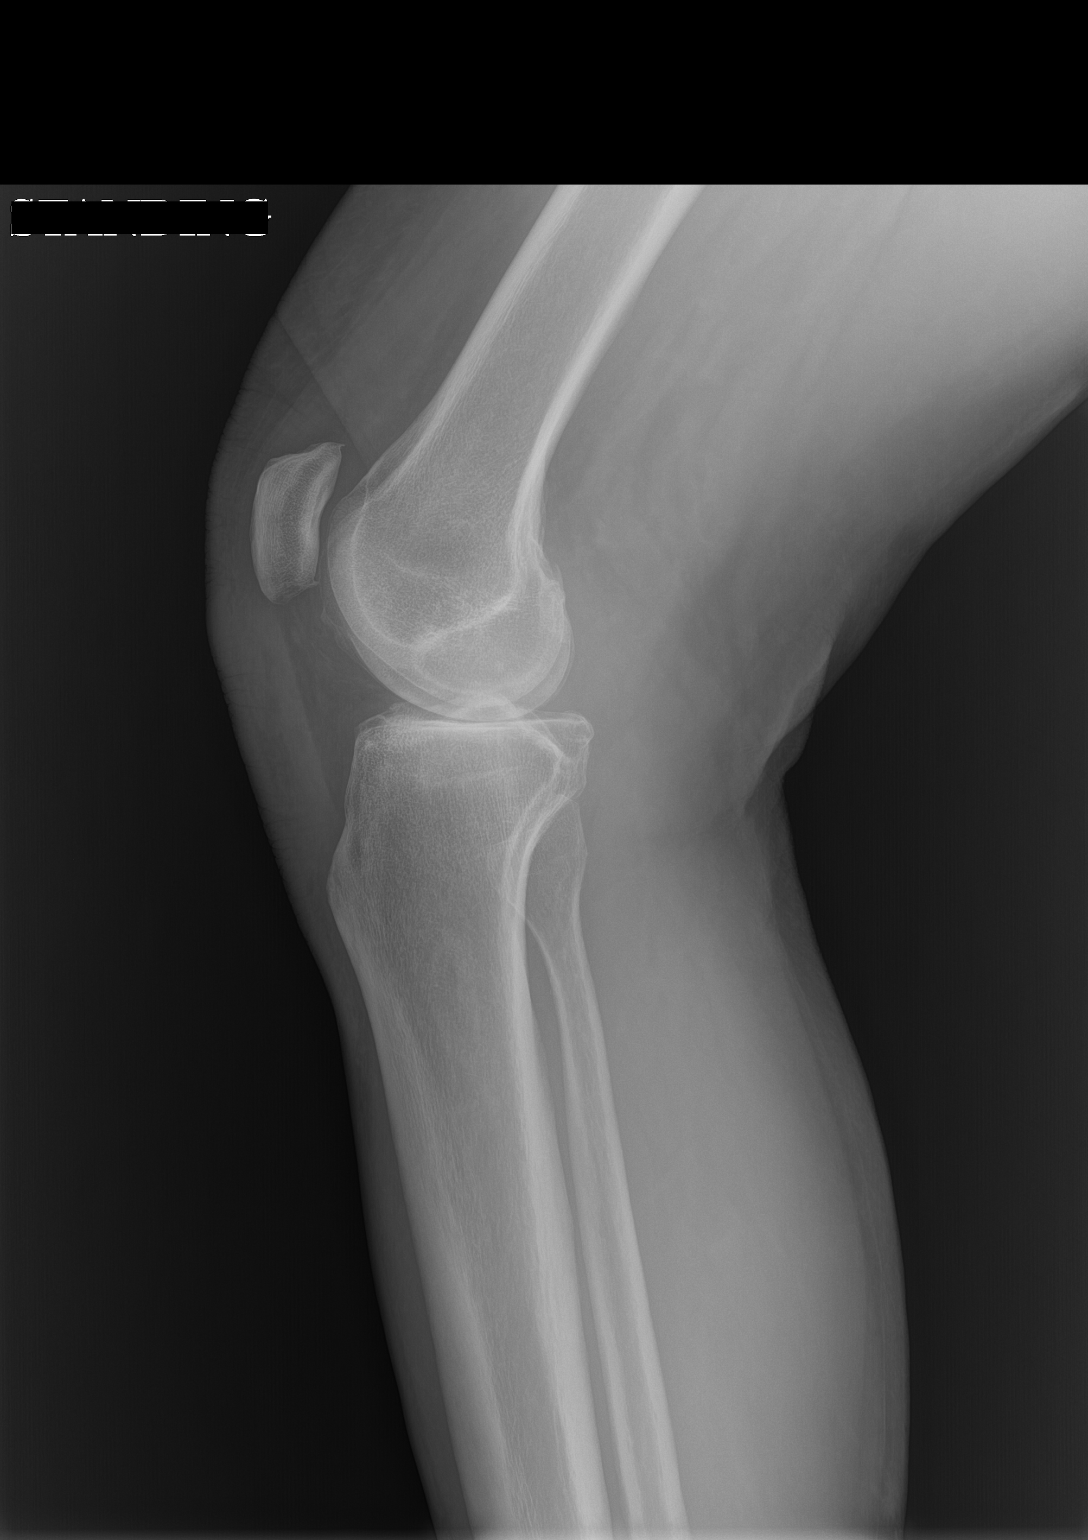

[patella]
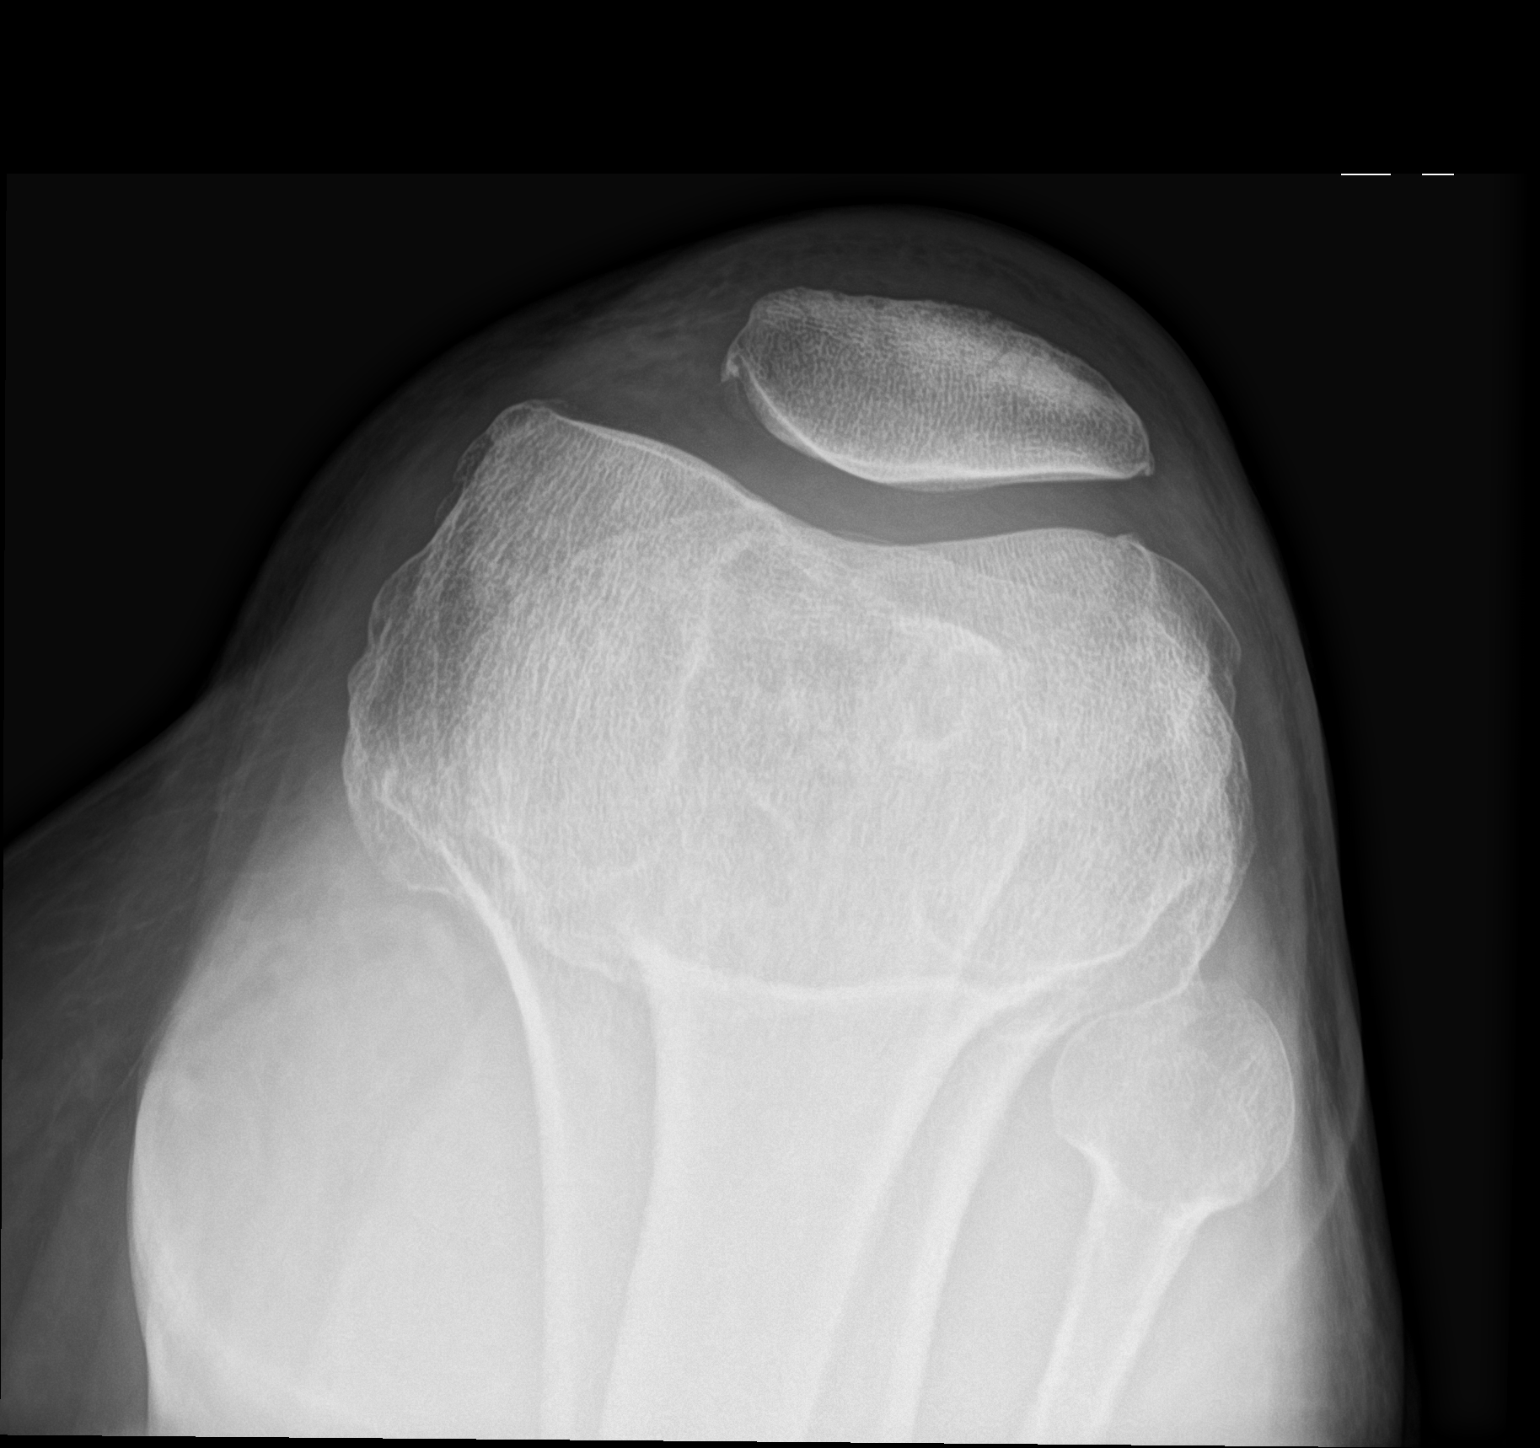

[3 of 3 positions shown; findings below may reference images not displayed]

FINDINGS: No fracture or malalignment. Mild patellofemoral and lateral joint
space degenerative change. Moderate medial joint space degenerative
change. No significant effusion.
IMPRESSION: Tricompartment arthritis, most advanced involving the medial
compartment.

## 2021-04-18 IMAGING — DX DG ANKLE COMPLETE 3+V*R*
3 series · 3 of 3 positions shown · non-contrast
Comparison: None.

CLINICAL DATA: Ankle sprain, pain

EXAM:
RIGHT ANKLE - COMPLETE 3+ VIEW

[ankle ap]
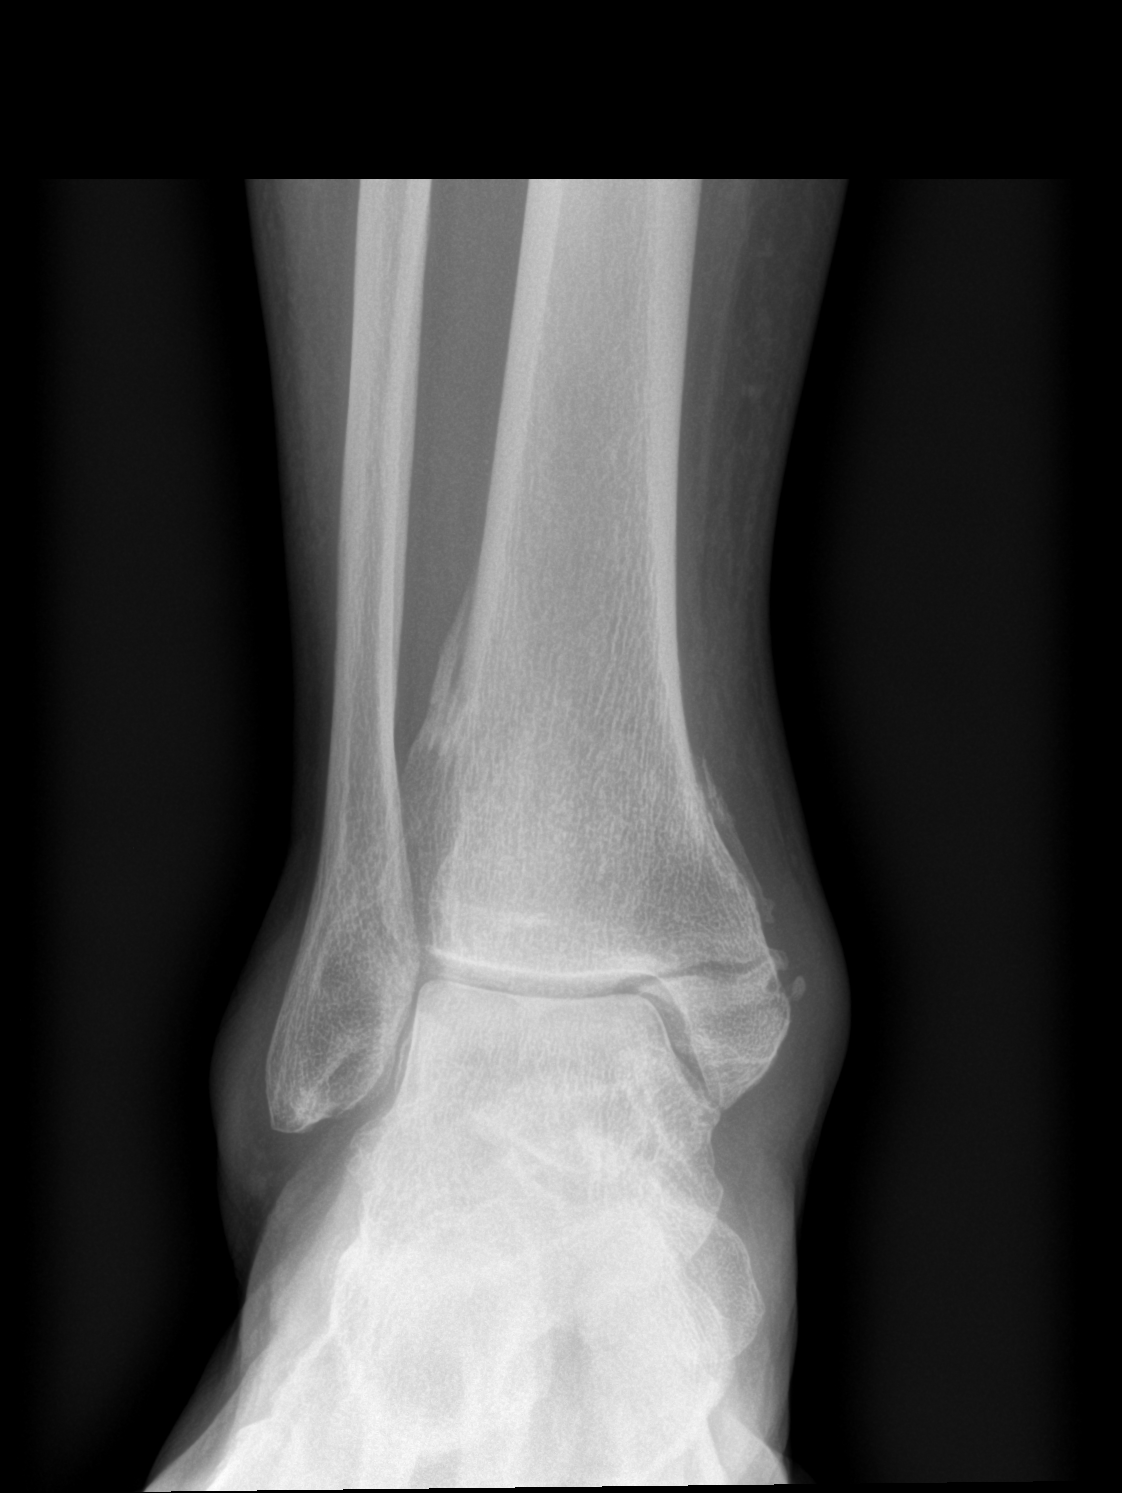

[ankle obl]
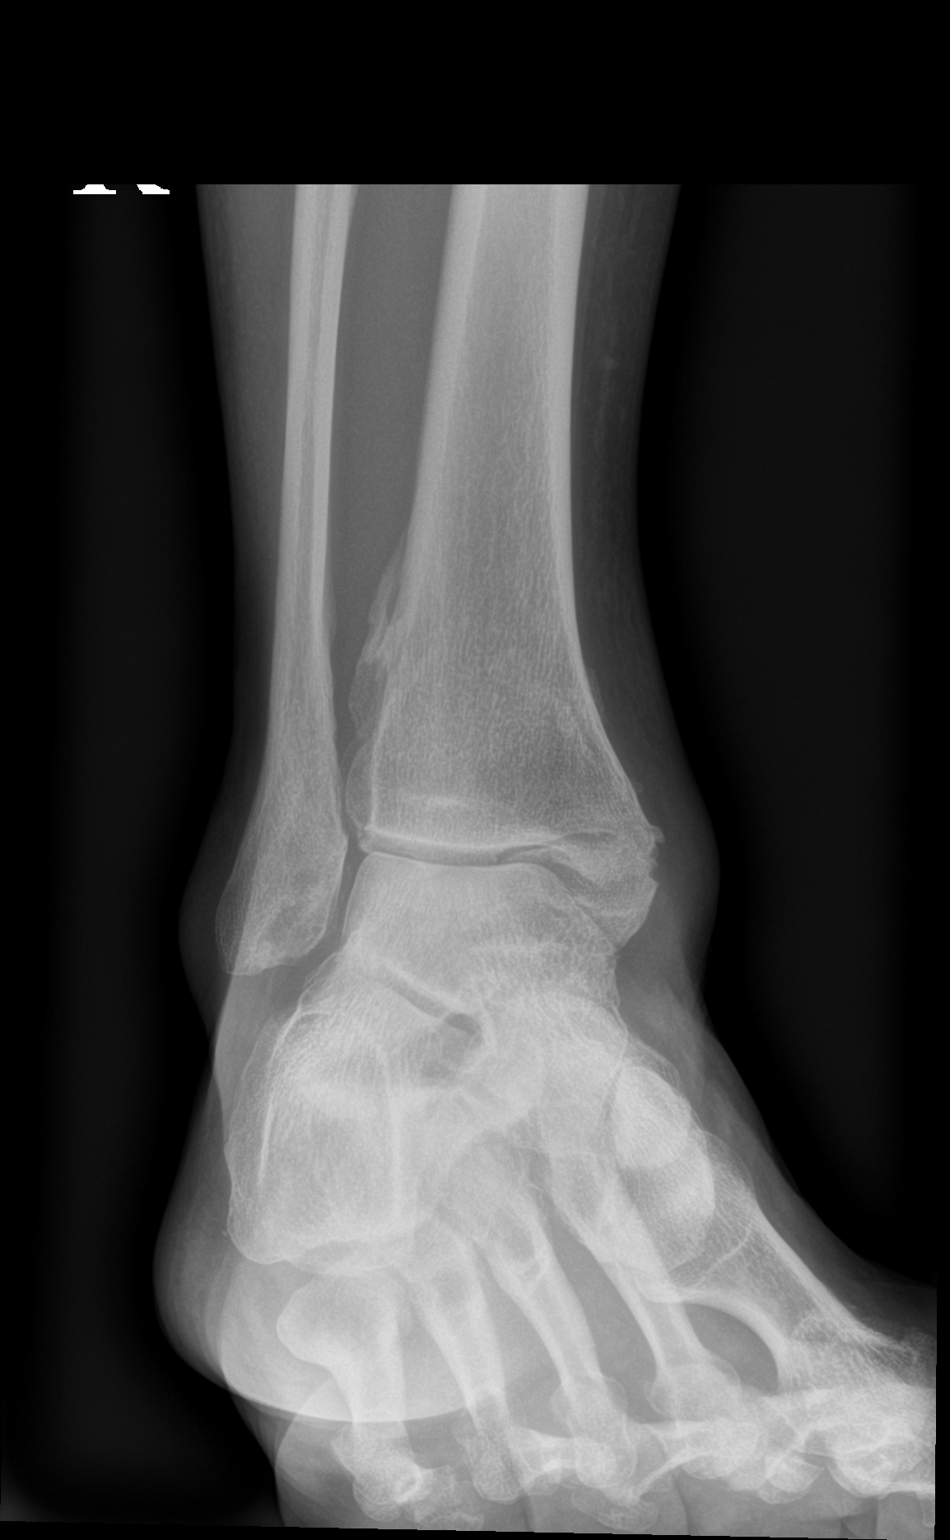

[ankle lat]
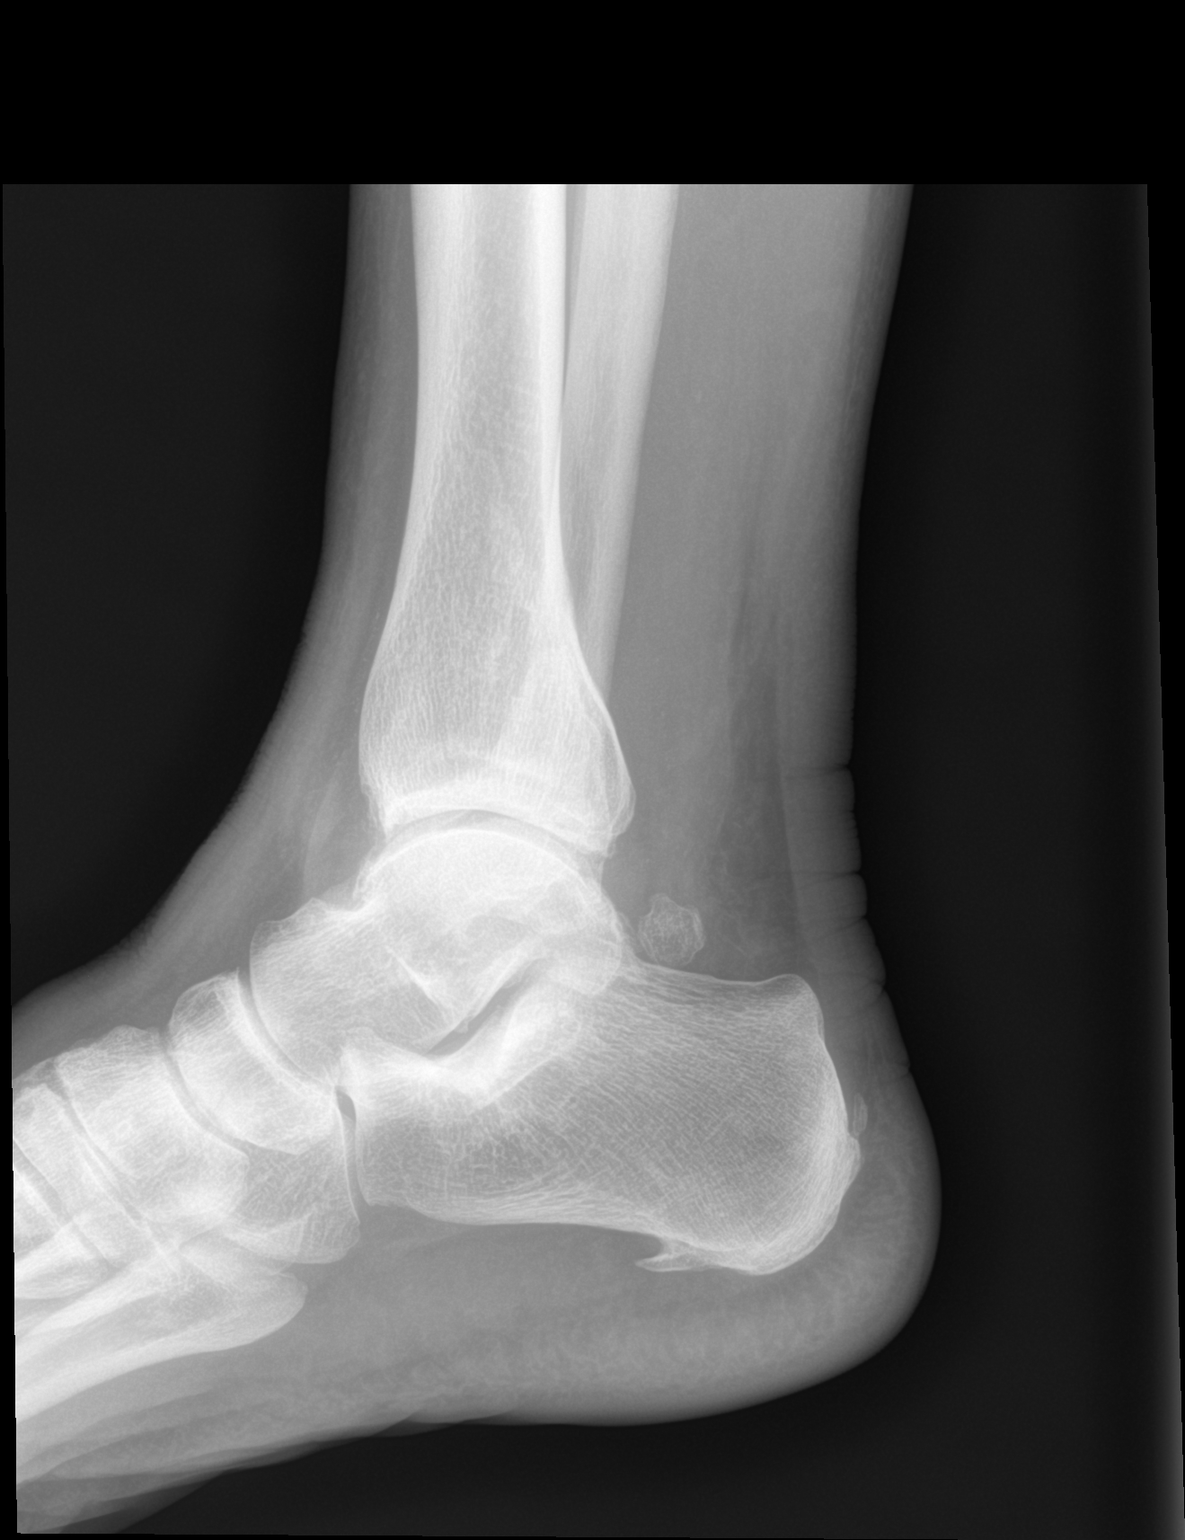

[3 of 3 positions shown; findings below may reference images not displayed]

FINDINGS: Lucency at the medial malleolus suspicious for a nondisplaced
fracture. Overlying soft tissue swelling suggests acute to subacute
process. Ankle mortise is symmetric. Small plantar calcaneal spur.
IMPRESSION: Findings are suspicious for nondisplaced medial malleolar fracture,
possibly acute or subacute given the presence of significant soft
tissue swelling.

## 2021-05-26 ENCOUNTER — Ambulatory Visit: Payer: Medicare Other | Admitting: Internal Medicine

## 2021-05-26 DIAGNOSIS — Z0289 Encounter for other administrative examinations: Secondary | ICD-10-CM

## 2021-06-06 ENCOUNTER — Telehealth: Payer: Self-pay | Admitting: Internal Medicine

## 2021-06-06 DIAGNOSIS — E785 Hyperlipidemia, unspecified: Secondary | ICD-10-CM

## 2021-06-06 DIAGNOSIS — I10 Essential (primary) hypertension: Secondary | ICD-10-CM

## 2021-06-06 DIAGNOSIS — N320 Bladder-neck obstruction: Secondary | ICD-10-CM

## 2021-06-06 NOTE — Telephone Encounter (Signed)
Patient is requesting lab work before his 1 yr follow up on 08-18-21. Please advise

## 2021-06-06 NOTE — Telephone Encounter (Signed)
Okay.  Done.  Thanks 

## 2021-06-07 NOTE — Telephone Encounter (Signed)
Lab appt has been made.Marland KitchenJohny Carpenter

## 2021-06-16 ENCOUNTER — Ambulatory Visit (INDEPENDENT_AMBULATORY_CARE_PROVIDER_SITE_OTHER): Payer: Medicare Other | Admitting: Internal Medicine

## 2021-06-16 ENCOUNTER — Other Ambulatory Visit: Payer: Self-pay

## 2021-06-16 ENCOUNTER — Encounter: Payer: Self-pay | Admitting: Internal Medicine

## 2021-06-16 DIAGNOSIS — E785 Hyperlipidemia, unspecified: Secondary | ICD-10-CM | POA: Diagnosis not present

## 2021-06-16 DIAGNOSIS — N200 Calculus of kidney: Secondary | ICD-10-CM | POA: Diagnosis not present

## 2021-06-16 DIAGNOSIS — H6123 Impacted cerumen, bilateral: Secondary | ICD-10-CM

## 2021-06-16 DIAGNOSIS — H6122 Impacted cerumen, left ear: Secondary | ICD-10-CM | POA: Diagnosis not present

## 2021-06-16 DIAGNOSIS — I10 Essential (primary) hypertension: Secondary | ICD-10-CM | POA: Diagnosis not present

## 2021-06-16 LAB — POCT URINALYSIS DIPSTICK
Bilirubin, UA: NEGATIVE
Blood, UA: NEGATIVE
Glucose, UA: NEGATIVE
Ketones, UA: NEGATIVE
Leukocytes, UA: NEGATIVE
Nitrite, UA: NEGATIVE
Protein, UA: POSITIVE — AB
Spec Grav, UA: 1.015 (ref 1.010–1.025)
Urobilinogen, UA: 0.2 E.U./dL
pH, UA: 7.5 (ref 5.0–8.0)

## 2021-06-16 MED ORDER — CIPROFLOXACIN HCL 500 MG PO TABS: 500.0000 mg | ORAL_TABLET | Freq: Two times a day (BID) | ORAL | 0 refills | Status: DC

## 2021-06-16 NOTE — Assessment & Plan Note (Signed)
A cardiac CT scan for calcium scoring offered 

## 2021-06-16 NOTE — Assessment & Plan Note (Signed)
L ear Will irrigate

## 2021-06-16 NOTE — Assessment & Plan Note (Signed)
Diovan and Amlodipine

## 2021-06-16 NOTE — Progress Notes (Signed)
Patient consent obtained. Irrigation with water and peroxide performed. Full view of tympanic membranes after procedure.  Patient tolerated procedure well.   

## 2021-06-16 NOTE — Assessment & Plan Note (Signed)
Left side. Dr Lovena Neighbours -S/p Lithotripsy 1/22 Check UA

## 2021-06-16 NOTE — Progress Notes (Signed)
Subjective:  Patient ID: Steven Carpenter, male    DOB: 01/28/1956  Age: 65 y.o. MRN: [id number]  CC: Follow-up (6 MONTH F/U)   HPI  Steven Carpenter presents for renal stone, HTN C/o L ear discomfort and fullness  Outpatient Medications Prior to Visit  Medication Sig Dispense Refill   amLODipine (NORVASC) 5 MG tablet Take 2 tablets (10 mg total) by mouth daily. 180 tablet 2   atorvastatin (LIPITOR) 10 MG tablet Take 1 tablet (10 mg total) by mouth daily. 90 tablet 2   valsartan (DIOVAN) 320 MG tablet Take 1 tablet (320 mg total) by mouth daily. 90 tablet 2   HYDROcodone-acetaminophen (NORCO) 7.5-325 MG tablet Take 1 tablet by mouth every 6 (six) hours as needed for moderate pain. (Patient not taking: Reported on 06/16/2021) 20 tablet 0   ondansetron (ZOFRAN) 4 MG tablet Take 1 tablet (4 mg total) by mouth daily as needed for nausea or vomiting. (Patient not taking: Reported on 06/16/2021) 30 tablet 1   oxyCODONE-acetaminophen (PERCOCET/ROXICET) 5-325 MG tablet Take 1-2 tablets by mouth every 6 (six) hours as needed for severe pain. (Patient not taking: Reported on 06/16/2021)     promethazine (PHENERGAN) 12.5 MG tablet Take 1 tablet (12.5 mg total) by mouth every 6 (six) hours as needed for nausea or vomiting. (Patient not taking: Reported on 06/16/2021) 30 tablet 1   tamsulosin (FLOMAX) 0.4 MG CAPS capsule Take 1 capsule (0.4 mg total) by mouth daily. (Patient not taking: Reported on 06/16/2021) 30 capsule 0   tamsulosin (FLOMAX) 0.4 MG CAPS capsule TAKE 1 CAPSULE BY MOUTH EVERY DAY (Patient not taking: Reported on 06/16/2021) 30 capsule 0   No facility-administered medications prior to visit.    ROS: Review of Systems  Constitutional:  Negative for appetite change, fatigue and unexpected weight change.  HENT:  Positive for ear pain and hearing loss. Negative for congestion, nosebleeds, sinus pressure, sneezing, sore throat and trouble swallowing.   Eyes:  Negative for itching and visual  disturbance.  Respiratory:  Negative for cough.   Cardiovascular:  Negative for chest pain, palpitations and leg swelling.  Gastrointestinal:  Negative for abdominal distention, blood in stool, diarrhea and nausea.  Genitourinary:  Negative for frequency and hematuria.  Musculoskeletal:  Negative for back pain, gait problem, joint swelling and neck pain.  Skin:  Negative for rash.  Neurological:  Negative for dizziness, tremors, speech difficulty and weakness.  Psychiatric/Behavioral:  Negative for agitation, dysphoric mood and sleep disturbance. The patient is not nervous/anxious.    Objective:  BP 138/84 (BP Location: Left Arm)   Pulse (!) 55   Temp 98 F (36.7 C) (Oral)   Ht 5\' 8"  (1.727 m)   Wt 182 lb 6.4 oz (82.7 kg)   SpO2 97%   BMI 27.73 kg/m   BP Readings from Last 3 Encounters:  06/16/21 138/84  01/06/21 125/76  12/30/20 (!) 144/72    Wt Readings from Last 3 Encounters:  06/16/21 182 lb 6.4 oz (82.7 kg)  01/06/21 181 lb 14.4 oz (82.5 kg)  12/30/20 187 lb (84.8 kg)    Physical Exam Constitutional:      General: He is not in acute distress.    Appearance: He is well-developed.     Comments: NAD  HENT:     Right Ear: Tympanic membrane and external ear normal.     Left Ear: Tympanic membrane and external ear normal. There is impacted cerumen.  Eyes:     Conjunctiva/sclera: Conjunctivae normal.  Pupils: Pupils are equal, round, and reactive to light.  Neck:     Thyroid: No thyromegaly.     Vascular: No JVD.  Cardiovascular:     Rate and Rhythm: Normal rate and regular rhythm.     Heart sounds: Normal heart sounds. No murmur heard.   No friction rub. No gallop.  Pulmonary:     Effort: Pulmonary effort is normal. No respiratory distress.     Breath sounds: Normal breath sounds. No wheezing or rales.  Chest:     Chest wall: No tenderness.  Abdominal:     General: Bowel sounds are normal. There is no distension.     Palpations: Abdomen is soft. There is no  mass.     Tenderness: There is no abdominal tenderness. There is no guarding or rebound.  Musculoskeletal:        General: No tenderness. Normal range of motion.     Cervical back: Normal range of motion.  Lymphadenopathy:     Cervical: No cervical adenopathy.  Skin:    General: Skin is warm and dry.     Findings: No rash.  Neurological:     Mental Status: He is alert and oriented to person, place, and time.     Cranial Nerves: No cranial nerve deficit.     Motor: No abnormal muscle tone.     Coordination: Coordination normal.     Gait: Gait normal.     Deep Tendon Reflexes: Reflexes are normal and symmetric.  Psychiatric:        Behavior: Behavior normal.        Thought Content: Thought content normal.        Judgment: Judgment normal.   Left earwax   Procedure Note :     Procedure :  Ear irrigation left ear   Indication:  Cerumen impaction    Risks, including pain, dizziness, eardrum perforation, bleeding, infection and others as well as benefits were explained to the patient in detail. Verbal consent was obtained and the patient agreed to proceed.    We used "The Elephant Ear Irrigation Device" filled with lukewarm water for irrigation. A large amount wax was recovered from L ear.  Procedure has also required manual wax removal/instrumentation with an ear wax curette and ear forceps   Tolerated well. Complications: None.   Postprocedure instructions :  Call if problems.  Lab Results  Component Value Date   WBC 6.0 08/05/2020   HGB 14.3 08/05/2020   HCT 42.4 08/05/2020   PLT 180.0 08/05/2020   GLUCOSE 100 (H) 12/30/2020   CHOL 176 08/05/2020   TRIG 67.0 08/05/2020   HDL 76.60 08/05/2020   LDLDIRECT 176.9 12/15/2013   LDLCALC 86 08/05/2020   ALT 13 08/05/2020   AST 13 08/05/2020   NA 135 12/30/2020   K 4.0 12/30/2020   CL 99 12/30/2020   CREATININE 0.96 12/30/2020   BUN 15 12/30/2020   CO2 29 12/30/2020   TSH 3.21 08/05/2020   PSA 2.15 08/05/2020    DG  Abd 1 View  Result Date: 01/06/2021 CLINICAL DATA:  Left ureteral calculus EXAM: ABDOMEN - 1 VIEW COMPARISON:  01/04/2021 FINDINGS: Left ureteral calculus is not definitely seen. There is question of a small area of increased density overlying the left L3 transverse process where the stone was previously present. Normal bowel gas pattern. No acute osseous abnormality. IMPRESSION: Left ureteral calculus is not definitely seen but may be unchanged in location at the L3 level neck. Electronically Signed  By: Macy Mis M.D.   On: 01/06/2021 13:46    Assessment & Plan:     Follow-up: No follow-ups on file.  Walker Kehr, MD

## 2021-07-31 ENCOUNTER — Other Ambulatory Visit: Payer: Self-pay | Admitting: Internal Medicine

## 2021-08-15 ENCOUNTER — Other Ambulatory Visit (INDEPENDENT_AMBULATORY_CARE_PROVIDER_SITE_OTHER): Payer: Medicare Other

## 2021-08-15 ENCOUNTER — Other Ambulatory Visit: Payer: Self-pay

## 2021-08-15 DIAGNOSIS — N320 Bladder-neck obstruction: Secondary | ICD-10-CM | POA: Diagnosis not present

## 2021-08-15 DIAGNOSIS — E785 Hyperlipidemia, unspecified: Secondary | ICD-10-CM | POA: Diagnosis not present

## 2021-08-15 DIAGNOSIS — I10 Essential (primary) hypertension: Secondary | ICD-10-CM | POA: Diagnosis not present

## 2021-08-15 LAB — URINALYSIS
Bilirubin Urine: NEGATIVE
Hgb urine dipstick: NEGATIVE
Ketones, ur: NEGATIVE
Leukocytes,Ua: NEGATIVE
Nitrite: NEGATIVE
Specific Gravity, Urine: 1.025 (ref 1.000–1.030)
Total Protein, Urine: NEGATIVE
Urine Glucose: NEGATIVE
Urobilinogen, UA: 0.2 (ref 0.0–1.0)
pH: 6 (ref 5.0–8.0)

## 2021-08-15 LAB — COMPREHENSIVE METABOLIC PANEL
ALT: 12 U/L (ref 0–53)
AST: 14 U/L (ref 0–37)
Albumin: 4.2 g/dL (ref 3.5–5.2)
Alkaline Phosphatase: 87 U/L (ref 39–117)
BUN: 24 mg/dL — ABNORMAL HIGH (ref 6–23)
CO2: 27 mEq/L (ref 19–32)
Calcium: 9.1 mg/dL (ref 8.4–10.5)
Chloride: 103 mEq/L (ref 96–112)
Creatinine, Ser: 0.8 mg/dL (ref 0.40–1.50)
GFR: 92.18 mL/min (ref >60.00)
Glucose, Bld: 83 mg/dL (ref 70–99)
Potassium: 4.3 mEq/L (ref 3.5–5.1)
Sodium: 138 mEq/L (ref 135–145)
Total Bilirubin: 0.5 mg/dL (ref 0.2–1.2)
Total Protein: 6.8 g/dL (ref 6.0–8.3)

## 2021-08-15 LAB — CBC WITH DIFFERENTIAL/PLATELET
Basophils Absolute: 0 10*3/uL (ref 0.0–0.1)
Basophils Relative: 0.6 % (ref 0.0–3.0)
Eosinophils Absolute: 0.1 10*3/uL (ref 0.0–0.7)
Eosinophils Relative: 2.5 % (ref 0.0–5.0)
HCT: 41.4 % (ref 39.0–52.0)
Hemoglobin: 13.9 g/dL (ref 13.0–17.0)
Lymphocytes Relative: 27.5 % (ref 12.0–46.0)
Lymphs Abs: 1.6 10*3/uL (ref 0.7–4.0)
MCHC: 33.7 g/dL (ref 30.0–36.0)
MCV: 88.5 fl (ref 78.0–100.0)
Monocytes Absolute: 0.5 10*3/uL (ref 0.1–1.0)
Monocytes Relative: 8 % (ref 3.0–12.0)
Neutro Abs: 3.5 10*3/uL (ref 1.4–7.7)
Neutrophils Relative %: 61.4 % (ref 43.0–77.0)
Platelets: 180 10*3/uL (ref 150.0–400.0)
RBC: 4.68 Mil/uL (ref 4.22–5.81)
RDW: 14.2 % (ref 11.5–15.5)
WBC: 5.7 10*3/uL (ref 4.0–10.5)

## 2021-08-15 LAB — PSA: PSA: 2.38 ng/mL (ref 0.10–4.00)

## 2021-08-15 LAB — LIPID PANEL
Cholesterol: 165 mg/dL (ref 0–200)
HDL: 74.1 mg/dL (ref >39.00)
LDL Cholesterol: 82 mg/dL (ref 0–99)
NonHDL: 91.03
Total CHOL/HDL Ratio: 2
Triglycerides: 47 mg/dL (ref 0.0–149.0)
VLDL: 9.4 mg/dL (ref 0.0–40.0)

## 2021-08-15 LAB — TSH: TSH: 2.68 u[IU]/mL (ref 0.35–5.50)

## 2021-08-18 ENCOUNTER — Ambulatory Visit (INDEPENDENT_AMBULATORY_CARE_PROVIDER_SITE_OTHER): Payer: Medicare Other | Admitting: Internal Medicine

## 2021-08-18 ENCOUNTER — Ambulatory Visit (INDEPENDENT_AMBULATORY_CARE_PROVIDER_SITE_OTHER): Payer: Medicare Other

## 2021-08-18 ENCOUNTER — Encounter: Payer: Self-pay | Admitting: Internal Medicine

## 2021-08-18 ENCOUNTER — Other Ambulatory Visit: Payer: Self-pay

## 2021-08-18 VITALS — BP 126/80 | HR 61 | Resp 16 | Ht 68.0 in | Wt 185.6 lb

## 2021-08-18 VITALS — BP 126/80 | HR 61 | Ht 68.0 in | Wt 185.6 lb

## 2021-08-18 DIAGNOSIS — Z0000 Encounter for general adult medical examination without abnormal findings: Secondary | ICD-10-CM

## 2021-08-18 DIAGNOSIS — E785 Hyperlipidemia, unspecified: Secondary | ICD-10-CM | POA: Diagnosis not present

## 2021-08-18 DIAGNOSIS — L853 Xerosis cutis: Secondary | ICD-10-CM

## 2021-08-18 NOTE — Progress Notes (Signed)
Subjective:  Patient ID: Steven Carpenter, male    DOB: [birth year]  Age: 65 y.o. MRN: [id number]  CC: Annual Exam   HPI Steven Carpenter presents for a well exam  Outpatient Medications Prior to Visit  Medication Sig Dispense Refill   amLODipine (NORVASC) 5 MG tablet TAKE 2 TABLETS DAILY 180 tablet 3   atorvastatin (LIPITOR) 10 MG tablet Take 1 tablet (10 mg total) by mouth daily. 90 tablet 2   valsartan (DIOVAN) 320 MG tablet Take 1 tablet (320 mg total) by mouth daily. 90 tablet 2   No facility-administered medications prior to visit.    ROS: Review of Systems  Constitutional:  Negative for appetite change, fatigue and unexpected weight change.  HENT:  Negative for congestion, nosebleeds, sneezing, sore throat and trouble swallowing.   Eyes:  Negative for itching and visual disturbance.  Respiratory:  Negative for cough.   Cardiovascular:  Negative for chest pain, palpitations and leg swelling.  Gastrointestinal:  Negative for abdominal distention, blood in stool, diarrhea and nausea.  Genitourinary:  Negative for frequency and hematuria.  Musculoskeletal:  Negative for back pain, gait problem, joint swelling and neck pain.  Skin:  Negative for rash.  Neurological:  Negative for dizziness, tremors, speech difficulty and weakness.  Psychiatric/Behavioral:  Negative for agitation, dysphoric mood and sleep disturbance. The patient is not nervous/anxious.    Objective:  BP 126/80 (BP Location: Left Arm)   Pulse 61   Ht '5\' 8"'$  (1.727 m)   Wt 185 lb 9.6 oz (84.2 kg)   SpO2 95%   BMI 28.22 kg/m   BP Readings from Last 3 Encounters:  08/18/21 126/80  08/18/21 126/80  06/16/21 138/84    Wt Readings from Last 3 Encounters:  08/18/21 185 lb 9.6 oz (84.2 kg)  08/18/21 185 lb 9.6 oz (84.2 kg)  06/16/21 182 lb 6.4 oz (82.7 kg)    Physical Exam Constitutional:      General: He is not in acute distress.    Appearance: Normal appearance. He is well-developed.     Comments: NAD   Eyes:     Conjunctiva/sclera: Conjunctivae normal.     Pupils: Pupils are equal, round, and reactive to light.  Neck:     Thyroid: No thyromegaly.     Vascular: No JVD.  Cardiovascular:     Rate and Rhythm: Normal rate and regular rhythm.     Heart sounds: Murmur heard.    No friction rub. No gallop.  Pulmonary:     Effort: Pulmonary effort is normal. No respiratory distress.     Breath sounds: Normal breath sounds. No wheezing or rales.  Chest:     Chest wall: No tenderness.  Abdominal:     General: Bowel sounds are normal. There is no distension.     Palpations: Abdomen is soft. There is no mass.     Tenderness: There is no abdominal tenderness. There is no guarding or rebound.  Genitourinary:    Rectum: Normal. Guaiac result negative.  Musculoskeletal:        General: No tenderness. Normal range of motion.     Cervical back: Normal range of motion.  Lymphadenopathy:     Cervical: No cervical adenopathy.  Skin:    General: Skin is warm and dry.     Findings: No rash.  Neurological:     Mental Status: He is alert and oriented to person, place, and time.     Cranial Nerves: No cranial nerve deficit.  Motor: No abnormal muscle tone.     Coordination: Coordination normal.     Gait: Gait normal.     Deep Tendon Reflexes: Reflexes are normal and symmetric.  Psychiatric:        Behavior: Behavior normal.        Thought Content: Thought content normal.        Judgment: Judgment normal.  glasses Prostate 1+  Lab Results  Component Value Date   WBC 5.7 08/15/2021   HGB 13.9 08/15/2021   HCT 41.4 08/15/2021   PLT 180.0 08/15/2021   GLUCOSE 83 08/15/2021   CHOL 165 08/15/2021   TRIG 47.0 08/15/2021   HDL 74.10 08/15/2021   LDLDIRECT 176.9 12/15/2013   LDLCALC 82 08/15/2021   ALT 12 08/15/2021   AST 14 08/15/2021   NA 138 08/15/2021   K 4.3 08/15/2021   CL 103 08/15/2021   CREATININE 0.80 08/15/2021   BUN 24 (H) 08/15/2021   CO2 27 08/15/2021   TSH 2.68  08/15/2021   PSA 2.38 08/15/2021    DG Abd 1 View  Result Date: 01/06/2021 CLINICAL DATA:  Left ureteral calculus EXAM: ABDOMEN - 1 VIEW COMPARISON:  01/04/2021 FINDINGS: Left ureteral calculus is not definitely seen. There is question of a small area of increased density overlying the left L3 transverse process where the stone was previously present. Normal bowel gas pattern. No acute osseous abnormality. IMPRESSION: Left ureteral calculus is not definitely seen but may be unchanged in location at the L3 level neck. Electronically Signed   By: Macy Mis M.D.   On: 01/06/2021 13:46    Assessment & Plan:     Walker Kehr, MD

## 2021-08-18 NOTE — Assessment & Plan Note (Addendum)
We discussed age appropriate health related issues, including available/recomended screening tests and vaccinations. We discussed a need for adhering to healthy diet and exercise. Labs were ordered to be later reviewed . All questions were answered. A  cardiac CT scan for calcium scoring offered. Flight exam form was filled out Derm appt q/12 mo

## 2021-08-18 NOTE — Patient Instructions (Addendum)
Steven Carpenter , Thank you for taking time to come for your Medicare Wellness Visit. I appreciate your ongoing commitment to your health goals. Please review the following plan we discussed and let me know if I can assist you in the future.   Screening recommendations/referrals: Colonoscopy: 07/14/2020; due every 5 years  Recommended yearly ophthalmology/optometry visit for glaucoma screening and checkup Recommended yearly dental visit for hygiene and checkup  Vaccinations: Influenza vaccine: due Fall 2022 Pneumococcal vaccine: 08/10/2020 Tdap vaccine: 08/10/2020; due every 10 years Shingles vaccine: never done; Please call your insurance company to determine your out of pocket expense for the Shingrix vaccine. You may receive this vaccine at your local pharmacy.   Covid-19: 05/10/2020, 05/31/2020, 11/30/2020  Advanced directives: Advance directive discussed with you today. Even though you declined this today please call our office should you change your mind and we can give you the proper paperwork for you to fill out.  Conditions/risks identified: Yes; Client understands the importance of follow-up with providers by attending scheduled visits and discussed goals to eat healthier, increase physical activity, exercise the brain, socialize more, get enough sleep and make time for laughter.  Next appointment: Please schedule your next Medicare Wellness Visit with your Nurse Health Advisor in 1 year by calling [phone].  Preventive Care 9 Years and Older, Male Preventive care refers to lifestyle choices and visits with your health care provider that can promote health and wellness. What does preventive care include? A yearly physical exam. This is also called an annual well check. Dental exams once or twice a year. Routine eye exams. Ask your health care provider how often you should have your eyes checked. Personal lifestyle choices, including: Daily care of your teeth and gums. Regular physical  activity. Eating a healthy diet. Avoiding tobacco and drug use. Limiting alcohol use. Practicing safe sex. Taking low doses of aspirin every day. Taking vitamin and mineral supplements as recommended by your health care provider. What happens during an annual well check? The services and screenings done by your health care provider during your annual well check will depend on your age, overall health, lifestyle risk factors, and family history of disease. Counseling  Your health care provider may ask you questions about your: Alcohol use. Tobacco use. Drug use. Emotional well-being. Home and relationship well-being. Sexual activity. Eating habits. History of falls. Memory and ability to understand (cognition). Work and work Statistician. Screening  You may have the following tests or measurements: Height, weight, and BMI. Blood pressure. Lipid and cholesterol levels. These may be checked every 5 years, or more frequently if you are over 52 years old. Skin check. Lung cancer screening. You may have this screening every year starting at age 62 if you have a 30-pack-year history of smoking and currently smoke or have quit within the past 15 years. Fecal occult blood test (FOBT) of the stool. You may have this test every year starting at age 65. Flexible sigmoidoscopy or colonoscopy. You may have a sigmoidoscopy every 5 years or a colonoscopy every 10 years starting at age 59. Prostate cancer screening. Recommendations will vary depending on your family history and other risks. Hepatitis C blood test. Hepatitis B blood test. Sexually transmitted disease (STD) testing. Diabetes screening. This is done by checking your blood sugar (glucose) after you have not eaten for a while (fasting). You may have this done every 1-3 years. Abdominal aortic aneurysm (AAA) screening. You may need this if you are a current or former smoker. Osteoporosis. You  may be screened starting at age 59 if you are  at high risk. Talk with your health care provider about your test results, treatment options, and if necessary, the need for more tests. Vaccines  Your health care provider may recommend certain vaccines, such as: Influenza vaccine. This is recommended every year. Tetanus, diphtheria, and acellular pertussis (Tdap, Td) vaccine. You may need a Td booster every 10 years. Zoster vaccine. You may need this after age 65. Pneumococcal 13-valent conjugate (PCV13) vaccine. One dose is recommended after age 90. Pneumococcal polysaccharide (PPSV23) vaccine. One dose is recommended after age 70. Talk to your health care provider about which screenings and vaccines you need and how often you need them. This information is not intended to replace advice given to you by your health care provider. Make sure you discuss any questions you have with your health care provider. Document Released: 01/07/2016 Document Revised: 08/30/2016 Document Reviewed: 10/12/2015 Elsevier Interactive Patient Education  2017 [place] Prevention in the Home Falls can cause injuries. They can happen to people of all ages. There are many things you can do to make your home safe and to help prevent falls. What can I do on the outside of my home? Regularly fix the edges of walkways and driveways and fix any cracks. Remove anything that might make you trip as you walk through a door, such as a raised step or threshold. Trim any bushes or trees on the path to your home. Use bright outdoor lighting. Clear any walking paths of anything that might make someone trip, such as rocks or tools. Regularly check to see if handrails are loose or broken. Make sure that both sides of any steps have handrails. Any raised decks and porches should have guardrails on the edges. Have any leaves, snow, or ice cleared regularly. Use sand or salt on walking paths during winter. Clean up any spills in your garage right away. This includes oil  or grease spills. What can I do in the bathroom? Use night lights. Install grab bars by the toilet and in the tub and shower. Do not use towel bars as grab bars. Use non-skid mats or decals in the tub or shower. If you need to sit down in the shower, use a plastic, non-slip stool. Keep the floor dry. Clean up any water that spills on the floor as soon as it happens. Remove soap buildup in the tub or shower regularly. Attach bath mats securely with double-sided non-slip rug tape. Do not have throw rugs and other things on the floor that can make you trip. What can I do in the bedroom? Use night lights. Make sure that you have a light by your bed that is easy to reach. Do not use any sheets or blankets that are too big for your bed. They should not hang down onto the floor. Have a firm chair that has side arms. You can use this for support while you get dressed. Do not have throw rugs and other things on the floor that can make you trip. What can I do in the kitchen? Clean up any spills right away. Avoid walking on wet floors. Keep items that you use a lot in easy-to-reach places. If you need to reach something above you, use a strong step stool that has a grab bar. Keep electrical cords out of the way. Do not use floor polish or wax that makes floors slippery. If you must use wax, use non-skid floor wax. Do  not have throw rugs and other things on the floor that can make you trip. What can I do with my stairs? Do not leave any items on the stairs. Make sure that there are handrails on both sides of the stairs and use them. Fix handrails that are broken or loose. Make sure that handrails are as long as the stairways. Check any carpeting to make sure that it is firmly attached to the stairs. Fix any carpet that is loose or worn. Avoid having throw rugs at the top or bottom of the stairs. If you do have throw rugs, attach them to the floor with carpet tape. Make sure that you have a light  switch at the top of the stairs and the bottom of the stairs. If you do not have them, ask someone to add them for you. What else can I do to help prevent falls? Wear shoes that: Do not have high heels. Have rubber bottoms. Are comfortable and fit you well. Are closed at the toe. Do not wear sandals. If you use a stepladder: Make sure that it is fully opened. Do not climb a closed stepladder. Make sure that both sides of the stepladder are locked into place. Ask someone to hold it for you, if possible. Clearly mark and make sure that you can see: Any grab bars or handrails. First and last steps. Where the edge of each step is. Use tools that help you move around (mobility aids) if they are needed. These include: Canes. Walkers. Scooters. Crutches. Turn on the lights when you go into a dark area. Replace any light bulbs as soon as they burn out. Set up your furniture so you have a clear path. Avoid moving your furniture around. If any of your floors are uneven, fix them. If there are any pets around you, be aware of where they are. Review your medicines with your doctor. Some medicines can make you feel dizzy. This can increase your chance of falling. Ask your doctor what other things that you can do to help prevent falls. This information is not intended to replace advice given to you by your health care provider. Make sure you discuss any questions you have with your health care provider. Document Released: 10/07/2009 Document Revised: 05/18/2016 Document Reviewed: 01/15/2015 Elsevier Interactive Patient Education  2017 [employer].

## 2021-08-18 NOTE — Progress Notes (Addendum)
Subjective:   Steven Carpenter is a 65 y.o. male who presents for an Initial Medicare Annual Wellness Visit.  Review of Systems     Cardiac Risk Factors include: advanced age (>63mn, >>52women);dyslipidemia;hypertension;family history of premature cardiovascular disease;male gender     Objective:    Today's Vitals   08/18/21 0912  BP: 126/80  Pulse: 61  Resp: 16  SpO2: 95%  Weight: 185 lb 9.6 oz (84.2 kg)  Height: '5\' 8"'$  (1.727 m)  PainSc: 0-No pain   Body mass index is 28.22 kg/m.  Advanced Directives 08/18/2021 01/06/2021 01/09/2017  Does Patient Have a Medical Advance Directive? No No No  Would patient like information on creating a medical advance directive? No - Patient declined No - Patient declined -    Current Medications (verified) Outpatient Encounter Medications as of 08/18/2021  Medication Sig   amLODipine (NORVASC) 5 MG tablet TAKE 2 TABLETS DAILY   atorvastatin (LIPITOR) 10 MG tablet Take 1 tablet (10 mg total) by mouth daily.   valsartan (DIOVAN) 320 MG tablet Take 1 tablet (320 mg total) by mouth daily.   [DISCONTINUED] ciprofloxacin (CIPRO) 500 MG tablet Take 1 tablet (500 mg total) by mouth 2 (two) times daily.   No facility-administered encounter medications on file as of 08/18/2021.    Allergies (verified) Patient has no known allergies.   History: Past Medical History:  Diagnosis Date   Heart murmur    all of life   Hx of adenomatous polyp of colon 02/01/2017   Hyperlipidemia    Hypertension    Past Surgical History:  Procedure Laterality Date   APPENDECTOMY     EXTRACORPOREAL SHOCK WAVE LITHOTRIPSY Left 01/06/2021   Procedure: EXTRACORPOREAL SHOCK WAVE LITHOTRIPSY (ESWL);  Surgeon: WCeasar Mons MD;  [facility]  Service: Urology;  Laterality: Left;   INGUINAL HERNIA REPAIR  08/06/12   bilateral   Family History  Problem Relation Age of Onset   Hypertension Mother    Cancer Mother        breast    Hypertension Father    Colon cancer Neg Hx    Esophageal cancer Neg Hx    Rectal cancer Neg Hx    Stomach cancer Neg Hx    Colon polyps Neg Hx    Social History   Socioeconomic History   Marital status: Widowed    Spouse name: Not on file   Number of children: Not on file   Years of education: Not on file   Highest education level: Not on file  Occupational History   Not on file  Tobacco Use   Smoking status: Never   Smokeless tobacco: Never  Vaping Use   Vaping Use: Never used  Substance and Sexual Activity   Alcohol use: Yes    Alcohol/week: 1.0 - 2.0 standard drink    Types: 1 - 2 Cans of beer per week    Comment: beer a few times per week   Drug use: No   Sexual activity: Yes    Partners: Female  Other Topics Concern   Not on file  Social History Narrative   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   Social Determinants of HRadio broadcast assistantStrain: Low Risk    Difficulty of Paying Living Expenses: Not hard at all  Food Insecurity: No Food Insecurity   Worried About RCharity fundraiserin the Last Year: Never true   Ran  Out of Food in the Last Year: Never true  Transportation Needs: No Transportation Needs   Lack of Transportation (Medical): No   Lack of Transportation (Non-Medical): No  Physical Activity: Sufficiently Active   Days of Exercise per Week: 5 days   Minutes of Exercise per Session: 30 min  Stress: No Stress Concern Present   Feeling of Stress : Not at all  Social Connections: Unknown   Frequency of Communication with Friends and Family: More than three times a week   Frequency of Social Gatherings with Friends and Family: More than three times a week   Attends Religious Services: Patient refused   Marine scientist or Organizations: Yes   Attends Music therapist: More than 4 times per year   Marital Status: Never married    Tobacco Counseling Counseling given: Not  Answered   Clinical Intake:  Pre-visit preparation completed: Yes  Pain : No/denies pain Pain Score: 0-No pain     BMI - recorded: 28.22 Nutritional Status: BMI 25 -29 Overweight Nutritional Risks: None Diabetes: No  How often do you need to have someone help you when you read instructions, pamphlets, or other written materials from your doctor or pharmacy?: 1 - Never What is the last grade level you completed in school?: High School Graduate  Diabetic? no  Interpreter Needed?: No  Information entered by :: Lisette Abu, LPN   Activities of Daily Living In your present state of health, do you have any difficulty performing the following activities: 08/18/2021 01/06/2021  Hearing? N N  Vision? N N  Difficulty concentrating or making decisions? N N  Walking or climbing stairs? N N  Dressing or bathing? N N  Doing errands, shopping? N -  Preparing Food and eating ? N -  Using the Toilet? N -  In the past six months, have you accidently leaked urine? N -  Do you have problems with loss of bowel control? N -  Managing your Medications? N -  Managing your Finances? N -  Housekeeping or managing your Housekeeping? N -  Some recent data might be hidden     Patient Care Team: Plotnikov, Evie Lacks, MD as PCP - General Lovena Neighbours Conception Oms, MD as Consulting Physician (Urology) Jola Schmidt, MD as Consulting Physician (Ophthalmology)  Indicate any recent Medical Services you may have received from other than [facility] providers in the past year (date may be approximate).     Assessment:   This is a routine wellness examination for [employer].  Hearing/Vision screen Hearing Screening - Comments:: Patient declined any hearing difficulty. Vision Screening - Comments:: Patient wears eye glasses.  Eye exam done annually by Dr. Jola Schmidt.  Dietary issues and exercise activities discussed: Current Exercise Habits: Home exercise routine, Type of exercise: walking;Other -  see comments (hiking, riding bike), Time (Minutes): 30, Frequency (Times/Week): 5, Weekly Exercise (Minutes/Week): 150, Intensity: Moderate, Exercise limited by: None identified   Goals Addressed               This Visit's Progress     Patient Stated (pt-stated)        To maintain my current health status by continuing to eat healthy, stay physically active and socially active.      Depression Screen PHQ 2/9 Scores 08/18/2021 12/30/2020 10/20/2019  PHQ - 2 Score 0 0 0    Fall Risk Fall Risk  08/18/2021 06/16/2021  Falls in the past year? 0 0  Number falls in past yr: 0  0  Injury with Fall? 0 0  Risk for fall due to : No Fall Risks No Fall Risks  Follow up Falls evaluation completed -    FALL RISK PREVENTION PERTAINING TO THE HOME:  Any stairs in or around the home? Yes  If so, are there any without handrails? No  Home free of loose throw rugs in walkways, pet beds, electrical cords, etc? Yes  Adequate lighting in your home to reduce risk of falls? Yes   ASSISTIVE DEVICES UTILIZED TO PREVENT FALLS:  Life alert? Yes  Use of a cane, walker or w/c? No  Grab bars in the bathroom? No  Shower chair or bench in shower? Yes  Elevated toilet seat or a handicapped toilet? Yes   TIMED UP AND GO:  Was the test performed? Yes .  Length of time to ambulate 10 feet: 5 sec.   Gait steady and fast without use of assistive device  Cognitive Function: Normal cognitive status assessed by direct observation by this Nurse Health Advisor. No abnormalities found.          Immunizations Immunization History  Administered Date(s) Administered   Influenza Whole 10/05/2008, 10/22/2009, 11/13/2011, 08/25/2012   Influenza,inj,Quad PF,6+ Mos 12/15/2013, 10/24/2016, 10/04/2017, 10/17/2018, 10/20/2019   Influenza-Unspecified 08/25/2014, 09/25/2015   PFIZER(Purple Top)SARS-COV-2 Vaccination 05/10/2020, 05/31/2020, 11/30/2020   Pneumococcal Conjugate-13 08/10/2020   Td 10/22/2009   Tdap  08/10/2020   Zoster, Live 10/24/2016    TDAP status: Up to date  Flu Vaccine status: Up to date  Pneumococcal vaccine status: Up to date  Covid-19 vaccine status: Completed vaccines  Qualifies for Shingles Vaccine? Yes   Zostavax completed Yes   Shingrix Completed?: No.    Education has been provided regarding the importance of this vaccine. Patient has been advised to call insurance company to determine out of pocket expense if they have not yet received this vaccine. Advised may also receive vaccine at local pharmacy or Health Dept. Verbalized acceptance and understanding.  Screening Tests Health Maintenance  Topic Date Due   Zoster Vaccines- Shingrix (1 of 2) Never done   COVID-19 Vaccine (4 - Booster for Pfizer series) 02/28/2021   INFLUENZA VACCINE  07/25/2021   PNA vac Low Risk Adult (2 of 2 - PPSV23) 08/10/2021   COLONOSCOPY (Pts 45-58yr Insurance coverage will need to be confirmed)  07/14/2025   TETANUS/TDAP  08/10/2030   Hepatitis C Screening  Completed   HPV VACCINES  Aged Out    Health Maintenance  Health Maintenance Due  Topic Date Due   Zoster Vaccines- Shingrix (1 of 2) Never done   COVID-19 Vaccine (4 - Booster for Pfizer series) 02/28/2021   INFLUENZA VACCINE  07/25/2021   PNA vac Low Risk Adult (2 of 2 - PPSV23) 08/10/2021    Colorectal cancer screening: Type of screening: Colonoscopy. Completed 07/14/2020. Repeat every 5 years  Lung Cancer Screening: (Low Dose CT Chest recommended if Age 66-80years, 30 pack-year currently smoking OR have quit w/in 15years.) does not qualify.   Lung Cancer Screening Referral: no  Additional Screening:  Hepatitis C Screening: does qualify; Completed yes  Vision Screening: Recommended annual ophthalmology exams for early detection of glaucoma and other disorders of the eye. Is the patient up to date with their annual eye exam?  Yes  Who is the provider or what is the name of the office in which the patient attends  annual eye exams? BJola Schmidt MD. If pt is not established with a provider, would they like  to be referred to a provider to establish care? No .   Dental Screening: Recommended annual dental exams for proper oral hygiene  Community Resource Referral / Chronic Care Management: CRR required this visit?  No   CCM required this visit?  No      Plan:     I have personally reviewed and noted the following in the patient's chart:   Medical and social history Use of alcohol, tobacco or illicit drugs  Current medications and supplements including opioid prescriptions. Patient is not currently taking opioid prescriptions. Functional ability and status Nutritional status Physical activity Advanced directives List of other physicians Hospitalizations, surgeries, and ER visits in previous 12 months Vitals Screenings to include cognitive, depression, and falls Referrals and appointments  In addition, I have reviewed and discussed with patient certain preventive protocols, quality metrics, and best practice recommendations. A written personalized care plan for preventive services as well as general preventive health recommendations were provided to patient.     Sheral Flow, LPN   [id number]   Nurse Notes:  The patient does not have a history of falls. I did complete a risk assessment for falls. A plan of care for falls was documented. The patient has no issues with activities of daily living.   Medical screening examination/treatment/procedure(s) were performed by non-physician practitioner and as supervising physician I was immediately available for consultation/collaboration.  I agree with above. Lew Dawes, MD

## 2021-08-18 NOTE — Assessment & Plan Note (Signed)
Derm appt q/12 mo

## 2021-08-18 NOTE — Assessment & Plan Note (Signed)
On Lipitor 

## 2021-11-30 ENCOUNTER — Telehealth: Payer: Medicare Other | Admitting: Nurse Practitioner

## 2021-11-30 DIAGNOSIS — R053 Chronic cough: Secondary | ICD-10-CM

## 2021-11-30 MED ORDER — PREDNISONE 20 MG PO TABS: 40.0000 mg | ORAL_TABLET | Freq: Every day | ORAL | 0 refills | Status: AC

## 2021-11-30 MED ORDER — BENZONATATE 100 MG PO CAPS: 100.0000 mg | ORAL_CAPSULE | Freq: Three times a day (TID) | ORAL | 0 refills | Status: DC | PRN

## 2021-11-30 MED ORDER — AZITHROMYCIN 250 MG PO TABS: ORAL_TABLET | ORAL | 0 refills | Status: DC

## 2021-11-30 NOTE — Progress Notes (Signed)
Virtual Visit Consent   Steven Carpenter, you are scheduled for a virtual visit with Mary-Margaret Hassell Done, [place], a [facility] provider, today.     Just as with appointments in the office, your consent must be obtained to participate.  Your consent will be active for this visit and any virtual visit you may have with one of our providers in the next 365 days.     If you have a MyChart account, a copy of this consent can be sent to you electronically.  All virtual visits are billed to your insurance company just like a traditional visit in the office.    As this is a virtual visit, video technology does not allow for your provider to perform a traditional examination.  This may limit your provider's ability to fully assess your condition.  If your provider identifies any concerns that need to be evaluated in person or the need to arrange testing (such as labs, EKG, etc.), we will make arrangements to do so.     Although advances in technology are sophisticated, we cannot ensure that it will always work on either your end or our end.  If the connection with a video visit is poor, the visit may have to be switched to a telephone visit.  With either a video or telephone visit, we are not always able to ensure that we have a secure connection.     I need to obtain your verbal consent now.   Are you willing to proceed with your visit today? YES   Steven Carpenter has provided verbal consent on 11/30/2021 for a virtual visit (video or telephone).   Mary-Margaret Hassell Done, FNP   Date: 11/30/2021 7:05 PM   Virtual Visit via Video Note   I, Mary-Margaret Hassell Done, connected with Steven Carpenter ([id number], [birth year]) on 11/30/21 at  7:15 PM EST by a video-enabled telemedicine application and verified that I am speaking with the correct person using two identifiers.  Location: Patient: Virtual Visit Location Patient: Home Provider: Virtual Visit Location Provider: Mobile   I discussed the limitations of evaluation  and management by telemedicine and the availability of in person appointments. The patient expressed understanding and agreed to proceed.    History of Present Illness: Steven Carpenter is a 65 y.o. who identifies as a male who was assigned male at birth, and is being seen today for cough .  HPI: Patient states he has had a chronic cough for about 10 days. Says it is about the same but is worse at night. He has been taking nyquil in the evenings and just cough drops during the day. He had covid at end of September, but got better from that. The cough is dry.  No SOB or wheezing.   Review of Systems  Constitutional:  Positive for malaise/fatigue. Negative for chills and fever.  HENT:  Positive for congestion. Negative for sore throat.   Respiratory:  Positive for cough. Negative for sputum production, shortness of breath and wheezing.   Musculoskeletal:  Negative for myalgias.  Neurological:  Negative for dizziness and headaches.   Problems:  Patient Active Problem List   Diagnosis Date Noted   Nephrolithiasis 06/16/2021   Flank pain 12/30/2020   Left lower quadrant abdominal pain 12/30/2020   Cerumen impaction 08/10/2020   Knee pain, right 08/10/2020   Murmur, cardiac 10/20/2019   Ankle sprain 10/20/2019   Hx of adenomatous polyp of colon 02/01/2017   Carotid bruit 10/24/2016   Dyslipidemia 12/16/2015   Dry skin  dermatitis 12/16/2015   Grief 12/22/2014   Hypertension 08/27/2012   Bilateral inguinal hernia (BIH), R>L 06/19/2012   Well adult exam 12/13/2011   Neoplasm of uncertain behavior of skin 12/13/2011   Actinic keratosis 10/25/2010   ELEVATED BLOOD PRESSURE 10/25/2010   CHEST PAIN 11/26/2008    Allergies: No Known Allergies Medications:  Current Outpatient Medications:    amLODipine (NORVASC) 5 MG tablet, TAKE 2 TABLETS DAILY, Disp: 180 tablet, Rfl: 3   atorvastatin (LIPITOR) 10 MG tablet, Take 1 tablet (10 mg total) by mouth daily., Disp: 90 tablet, Rfl: 2   valsartan  (DIOVAN) 320 MG tablet, Take 1 tablet (320 mg total) by mouth daily., Disp: 90 tablet, Rfl: 2  Observations/Objective: Patient is well-developed, well-nourished in no acute distress.  Resting comfortably  at home.  Head is normocephalic, atraumatic.  No labored breathing.  Speech is clear and coherent with logical content.  Patient is alert and oriented at baseline.  Voice raspy Dry cough  Assessment and Plan:  [name] in today with chief complaint of cough  1. Persistent cough Force fluids Rest Run humidifier  Meds ordered this encounter  Medications   azithromycin (ZITHROMAX Z-PAK) 250 MG tablet    Sig: As directed    Dispense:  6 tablet    Refill:  0    Order Specific Question:   Supervising Provider    Answer:   Sabra Heck, BRIAN [[id number]]   predniSONE (DELTASONE) 20 MG tablet    Sig: Take 2 tablets (40 mg total) by mouth daily with breakfast for 5 days. 2 po daily for 5 days    Dispense:  10 tablet    Refill:  0    Order Specific Question:   Supervising Provider    Answer:   Sabra Heck, BRIAN [[id number]]   benzonatate (TESSALON PERLES) 100 MG capsule    Sig: Take 1 capsule (100 mg total) by mouth 3 (three) times daily as needed for cough.    Dispense:  20 capsule    Refill:  0    Order Specific Question:   Supervising Provider    Answer:   Noemi Chapel [[id number]]     Follow Up Instructions: I discussed the assessment and treatment plan with the patient. The patient was provided an opportunity to ask questions and all were answered. The patient agreed with the plan and demonstrated an understanding of the instructions.  A copy of instructions were sent to the patient via MyChart.  The patient was advised to call back or seek an in-person evaluation if the symptoms worsen or if the condition fails to improve as anticipated.  Time:  I spent 10 minutes with the patient via telehealth technology discussing the above problems/concerns.    Mary-Margaret Hassell Done, FNP

## 2021-12-31 ENCOUNTER — Other Ambulatory Visit: Payer: Self-pay | Admitting: Internal Medicine

## 2022-01-18 ENCOUNTER — Other Ambulatory Visit: Payer: Self-pay | Admitting: Internal Medicine

## 2022-01-19 ENCOUNTER — Other Ambulatory Visit: Payer: Self-pay | Admitting: Internal Medicine

## 2022-08-09 ENCOUNTER — Other Ambulatory Visit: Payer: Self-pay | Admitting: Internal Medicine

## 2022-08-23 ENCOUNTER — Ambulatory Visit (INDEPENDENT_AMBULATORY_CARE_PROVIDER_SITE_OTHER): Payer: Medicare Other | Admitting: Internal Medicine

## 2022-08-23 ENCOUNTER — Encounter: Payer: Self-pay | Admitting: Internal Medicine

## 2022-08-23 VITALS — BP 128/72 | HR 56 | Temp 98.5°F | Ht 68.0 in | Wt 189.4 lb

## 2022-08-23 DIAGNOSIS — I10 Essential (primary) hypertension: Secondary | ICD-10-CM | POA: Diagnosis not present

## 2022-08-23 DIAGNOSIS — Z23 Encounter for immunization: Secondary | ICD-10-CM | POA: Diagnosis not present

## 2022-08-23 DIAGNOSIS — E785 Hyperlipidemia, unspecified: Secondary | ICD-10-CM

## 2022-08-23 DIAGNOSIS — N320 Bladder-neck obstruction: Secondary | ICD-10-CM

## 2022-08-23 DIAGNOSIS — Z125 Encounter for screening for malignant neoplasm of prostate: Secondary | ICD-10-CM

## 2022-08-23 DIAGNOSIS — N200 Calculus of kidney: Secondary | ICD-10-CM | POA: Diagnosis not present

## 2022-08-23 DIAGNOSIS — Z0000 Encounter for general adult medical examination without abnormal findings: Secondary | ICD-10-CM

## 2022-08-23 LAB — LIPID PANEL
Cholesterol: 193 mg/dL (ref 0–200)
HDL: 82.5 mg/dL (ref >39.00)
LDL Cholesterol: 97 mg/dL (ref 0–99)
NonHDL: 110.81
Total CHOL/HDL Ratio: 2
Triglycerides: 67 mg/dL (ref 0.0–149.0)
VLDL: 13.4 mg/dL (ref 0.0–40.0)

## 2022-08-23 LAB — CBC WITH DIFFERENTIAL/PLATELET
Basophils Absolute: 0 10*3/uL (ref 0.0–0.1)
Basophils Relative: 0.7 % (ref 0.0–3.0)
Eosinophils Absolute: 0.2 10*3/uL (ref 0.0–0.7)
Eosinophils Relative: 2.9 % (ref 0.0–5.0)
HCT: 44.1 % (ref 39.0–52.0)
Hemoglobin: 14.6 g/dL (ref 13.0–17.0)
Lymphocytes Relative: 24 % (ref 12.0–46.0)
Lymphs Abs: 1.6 10*3/uL (ref 0.7–4.0)
MCHC: 33.1 g/dL (ref 30.0–36.0)
MCV: 88.1 fl (ref 78.0–100.0)
Monocytes Absolute: 0.5 10*3/uL (ref 0.1–1.0)
Monocytes Relative: 7.8 % (ref 3.0–12.0)
Neutro Abs: 4.4 10*3/uL (ref 1.4–7.7)
Neutrophils Relative %: 64.6 % (ref 43.0–77.0)
Platelets: 184 10*3/uL (ref 150.0–400.0)
RBC: 5 Mil/uL (ref 4.22–5.81)
RDW: 14.4 % (ref 11.5–15.5)
WBC: 6.8 10*3/uL (ref 4.0–10.5)

## 2022-08-23 LAB — COMPREHENSIVE METABOLIC PANEL
ALT: 18 U/L (ref 0–53)
AST: 17 U/L (ref 0–37)
Albumin: 4.6 g/dL (ref 3.5–5.2)
Alkaline Phosphatase: 88 U/L (ref 39–117)
BUN: 17 mg/dL (ref 6–23)
CO2: 29 mEq/L (ref 19–32)
Calcium: 9.2 mg/dL (ref 8.4–10.5)
Chloride: 103 mEq/L (ref 96–112)
Creatinine, Ser: 0.79 mg/dL (ref 0.40–1.50)
GFR: 91.87 mL/min (ref >60.00)
Glucose, Bld: 88 mg/dL (ref 70–99)
Potassium: 4.2 mEq/L (ref 3.5–5.1)
Sodium: 139 mEq/L (ref 135–145)
Total Bilirubin: 0.8 mg/dL (ref 0.2–1.2)
Total Protein: 7.3 g/dL (ref 6.0–8.3)

## 2022-08-23 LAB — TSH: TSH: 2.71 u[IU]/mL (ref 0.35–5.50)

## 2022-08-23 LAB — PSA: PSA: 4.63 ng/mL — ABNORMAL HIGH (ref 0.10–4.00)

## 2022-08-23 LAB — URINALYSIS
Bilirubin Urine: NEGATIVE
Hgb urine dipstick: NEGATIVE
Ketones, ur: NEGATIVE
Leukocytes,Ua: NEGATIVE
Nitrite: NEGATIVE
Specific Gravity, Urine: 1.015 (ref 1.000–1.030)
Total Protein, Urine: NEGATIVE
Urine Glucose: NEGATIVE
Urobilinogen, UA: 0.2 (ref 0.0–1.0)
pH: 7.5 (ref 5.0–8.0)

## 2022-08-23 NOTE — Progress Notes (Signed)
Subjective:  Patient ID: Steven Carpenter, male    DOB: October 18, 1956  Age: 66 y.o. 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 Annual appt due  must see provider for future refills 180 tablet 0   atorvastatin (LIPITOR) 10 MG tablet TAKE 1 TABLET BY MOUTH EVERY DAY 90 tablet 3   valsartan (DIOVAN) 320 MG tablet TAKE 1 TABLET DAILY 90 tablet 3   azithromycin (ZITHROMAX Z-PAK) 250 MG tablet As directed (Patient not taking: Reported on 08/23/2022) 6 tablet 0   benzonatate (TESSALON PERLES) 100 MG capsule Take 1 capsule (100 mg total) by mouth 3 (three) times daily as needed for cough. (Patient not taking: Reported on 08/23/2022) 20 capsule 0   No facility-administered medications prior to visit.    ROS: Review of Systems  Constitutional:  Positive for unexpected weight change. Negative for appetite change and fatigue.  HENT:  Negative for congestion, nosebleeds, sneezing, sore throat and trouble swallowing.   Eyes:  Negative for itching and visual disturbance.  Respiratory:  Negative for cough.   Cardiovascular:  Negative for chest pain, palpitations and leg swelling.  Gastrointestinal:  Negative for abdominal distention, blood in stool, diarrhea and nausea.  Genitourinary:  Negative for frequency and hematuria.  Musculoskeletal:  Negative for back pain, gait problem, joint swelling and neck pain.  Skin:  Negative for rash.  Neurological:  Negative for dizziness, tremors, speech difficulty and weakness.  Psychiatric/Behavioral:  Negative for agitation, dysphoric mood and sleep disturbance. The patient is not nervous/anxious.     Objective:  BP 128/72 (BP Location: Left Arm)   Pulse (!) 56   Temp 98.5 F (36.9 C) (Oral)   Ht '5\' 8"'$   (1.727 m)   Wt 189 lb 6.4 oz (85.9 kg)   SpO2 96%   BMI 28.80 kg/m   BP Readings from Last 3 Encounters:  08/23/22 128/72  08/18/21 126/80  08/18/21 126/80    Wt Readings from Last 3 Encounters:  08/23/22 189 lb 6.4 oz (85.9 kg)  08/18/21 185 lb 9.6 oz (84.2 kg)  08/18/21 185 lb 9.6 oz (84.2 kg)    Physical Exam Constitutional:      General: He is not in acute distress.    Appearance: He is well-developed.     Comments: NAD  Eyes:     Conjunctiva/sclera: Conjunctivae normal.     Pupils: Pupils are equal, round, and reactive to light.  Neck:     Thyroid: No thyromegaly.     Vascular: No JVD.  Cardiovascular:     Rate and Rhythm: Normal rate and regular rhythm.     Heart sounds: Normal heart sounds. No murmur heard.    No friction rub. No gallop.  Pulmonary:     Effort: Pulmonary effort is normal. No respiratory distress.     Breath sounds: Normal breath sounds. No wheezing or rales.  Chest:     Chest wall: No tenderness.  Abdominal:     General: Bowel sounds are normal. There is no distension.     Palpations: Abdomen is soft. There is no mass.     Tenderness: There is no abdominal tenderness. There is no guarding or rebound.  Genitourinary:    Rectum: Normal.  Guaiac result negative.  Musculoskeletal:        General: No tenderness. Normal range of motion.     Cervical back: Normal range of motion.  Lymphadenopathy:     Cervical: No cervical adenopathy.  Skin:    General: Skin is warm and dry.     Findings: No rash.  Neurological:     Mental Status: He is alert and oriented to person, place, and time.     Cranial Nerves: No cranial nerve deficit.     Motor: No abnormal muscle tone.     Coordination: Coordination normal.     Gait: Gait normal.     Deep Tendon Reflexes: Reflexes are normal and symmetric.  Psychiatric:        Behavior: Behavior normal.        Thought Content: Thought content normal.        Judgment: Judgment normal.   Prostate 1+  Lab  Results  Component Value Date   WBC 5.7 08/15/2021   HGB 13.9 08/15/2021   HCT 41.4 08/15/2021   PLT 180.0 08/15/2021   GLUCOSE 83 08/15/2021   CHOL 165 08/15/2021   TRIG 47.0 08/15/2021   HDL 74.10 08/15/2021   LDLDIRECT 176.9 12/15/2013   LDLCALC 82 08/15/2021   ALT 12 08/15/2021   AST 14 08/15/2021   NA 138 08/15/2021   K 4.3 08/15/2021   CL 103 08/15/2021   CREATININE 0.80 08/15/2021   BUN 24 (H) 08/15/2021   CO2 27 08/15/2021   TSH 2.68 08/15/2021   PSA 2.38 08/15/2021    DG Abd 1 View  Result Date: 01/06/2021 CLINICAL DATA:  Left ureteral calculus EXAM: ABDOMEN - 1 VIEW COMPARISON:  01/04/2021 FINDINGS: Left ureteral calculus is not definitely seen. There is question of a small area of increased density overlying the left L3 transverse process where the stone was previously present. Normal bowel gas pattern. No acute osseous abnormality. IMPRESSION: Left ureteral calculus is not definitely seen but may be unchanged in location at the L3 level neck. Electronically Signed   By: Macy Mis M.D.   On: 01/06/2021 13:46    Assessment & Plan:   Problem List Items Addressed This Visit     Dyslipidemia   Relevant Orders   TSH   Urinalysis   CBC with Differential/Platelet   Lipid panel   PSA   Comprehensive metabolic panel   Hypertension   Relevant Orders   TSH   Urinalysis   CBC with Differential/Platelet   Lipid panel   PSA   Comprehensive metabolic panel   Nephrolithiasis   Relevant Orders   TSH   Urinalysis   CBC with Differential/Platelet   Lipid panel   PSA   Comprehensive metabolic panel   Well adult exam - Primary    We discussed age appropriate health related issues, including available/recomended screening tests and vaccinations. We discussed a need for adhering to healthy diet and exercise. Labs were ordered to be later reviewed . All questions were answered. Flight exam form was filled out 8/22 A  cardiac CT scan for calcium scoring offered  10/20, 8/22 Derm appt q/12 mo for AKs Mohammedali has a Stage manager,  he [place]      Relevant Orders   TSH   Urinalysis   CBC with Differential/Platelet   Lipid panel   PSA   Comprehensive metabolic panel   Other Visit Diagnoses     Bladder neck obstruction  Relevant Orders   PSA         No orders of the defined types were placed in this encounter.     Follow-up: Return in about 1 year (around 08/24/2023) for Wellness Exam.  Walker Kehr, MD

## 2022-08-23 NOTE — Addendum Note (Signed)
Addended by: Earnstine Regal on: 08/23/2022 10:56 AM   Modules accepted: Orders

## 2022-08-23 NOTE — Assessment & Plan Note (Signed)
We discussed age appropriate health related issues, including available/recomended screening tests and vaccinations. We discussed a need for adhering to healthy diet and exercise. Labs were ordered to be later reviewed . All questions were answered. 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

## 2022-08-29 ENCOUNTER — Telehealth: Payer: Self-pay | Admitting: Internal Medicine

## 2022-08-29 DIAGNOSIS — R972 Elevated prostate specific antigen [PSA]: Secondary | ICD-10-CM

## 2022-08-29 NOTE — Telephone Encounter (Signed)
MD out of the office this week will hold msg until MD return on Monday 09/04/22.Marland KitchenJohny Carpenter

## 2022-08-29 NOTE — Telephone Encounter (Signed)
Patient would like to be referred to a urologist - Please advise

## 2022-09-02 NOTE — Telephone Encounter (Signed)
Will do. Thank you

## 2022-09-20 ENCOUNTER — Telehealth: Payer: Self-pay | Admitting: Internal Medicine

## 2022-09-20 NOTE — Telephone Encounter (Signed)
LVM for pt to rtn my call to schedule AWV with [facility] call back # [phone] 

## 2022-10-11 ENCOUNTER — Telehealth: Payer: Self-pay | Admitting: Internal Medicine

## 2022-10-11 NOTE — Telephone Encounter (Signed)
PT calls today in regards to billing issues from last visit with Dr.Plotnikov. PT was seen for their annual visit on 08/30 and had received their Shingles vaccine as well as several blood tests. Neither of these are being covered by their Medicare Part A and B or their supplementary. PT stated this had happened before and that he wasn't sure if there was anything we could change on the coding end of things. PT has already spoken with our billing department and they had routed him back to [place].  CB: [phone]

## 2022-10-12 NOTE — Telephone Encounter (Signed)
Lucy, please forward this message to billing, I guess.    I only see Prevnar 20 being administered on 08/23/2022.  Do I need to correct coding for lab work?  Please, do not schedule physicals for patients who have Medicare and their physicals or physical exam labs are not covered by their supplemental insurance.  Thanks

## 2022-11-18 ENCOUNTER — Other Ambulatory Visit: Payer: Self-pay | Admitting: Internal Medicine

## 2023-02-13 ENCOUNTER — Ambulatory Visit (INDEPENDENT_AMBULATORY_CARE_PROVIDER_SITE_OTHER): Payer: Medicare Other

## 2023-02-13 VITALS — Ht 68.0 in | Wt 180.0 lb

## 2023-02-13 DIAGNOSIS — Z0000 Encounter for general adult medical examination without abnormal findings: Secondary | ICD-10-CM

## 2023-02-13 NOTE — Patient Instructions (Signed)
Steven Carpenter , Thank you for taking time to come for your Medicare Wellness Visit. I appreciate your ongoing commitment to your health goals. Please review the following plan we discussed and let me know if I can assist you in the future.   These are the goals we discussed:  Goals       Patient Stated (pt-stated)      To maintain my current health status by continuing to eat healthy, stay physically active and socially active.        This is a list of the screening recommended for you and due dates:  Health Maintenance  Topic Date Due   Zoster (Shingles) Vaccine (1 of 2) Never done   COVID-19 Vaccine (4 - 2023-24 season) 08/25/2022   Medicare Annual Wellness Visit  02/14/2024   Colon Cancer Screening  07/14/2025   DTaP/Tdap/Td vaccine (3 - Td or Tdap) 08/10/2030   Pneumonia Vaccine  Completed   Flu Shot  Completed   Hepatitis C Screening: USPSTF Recommendation to screen - Ages 18-79 yo.  Completed   HPV Vaccine  Aged Out    Advanced directives: No  Conditions/risks identified: Yes  Next appointment: Follow up in one year for your annual wellness visit.   Preventive Care 20 Years and Older, Male  Preventive care refers to lifestyle choices and visits with your health care provider that can promote health and wellness. What does preventive care include? A yearly physical exam. This is also called an annual well check. Dental exams once or twice a year. Routine eye exams. Ask your health care provider how often you should have your eyes checked. Personal lifestyle choices, including: Daily care of your teeth and gums. Regular physical activity. Eating a healthy diet. Avoiding tobacco and drug use. Limiting alcohol use. Practicing safe sex. Taking low doses of aspirin every day. Taking vitamin and mineral supplements as recommended by your health care provider. What happens during an annual well check? The services and screenings done by your health care provider during your  annual well check will depend on your age, overall health, lifestyle risk factors, and family history of disease. Counseling  Your health care provider may ask you questions about your: Alcohol use. Tobacco use. Drug use. Emotional well-being. Home and relationship well-being. Sexual activity. Eating habits. History of falls. Memory and ability to understand (cognition). Work and work Statistician. Screening  You may have the following tests or measurements: Height, weight, and BMI. Blood pressure. Lipid and cholesterol levels. These may be checked every 5 years, or more frequently if you are over 45 years old. Skin check. Lung cancer screening. You may have this screening every year starting at age 41 if you have a 30-pack-year history of smoking and currently smoke or have quit within the past 15 years. Fecal occult blood test (FOBT) of the stool. You may have this test every year starting at age 60. Flexible sigmoidoscopy or colonoscopy. You may have a sigmoidoscopy every 5 years or a colonoscopy every 10 years starting at age 6. Prostate cancer screening. Recommendations will vary depending on your family history and other risks. Hepatitis C blood test. Hepatitis B blood test. Sexually transmitted disease (STD) testing. Diabetes screening. This is done by checking your blood sugar (glucose) after you have not eaten for a while (fasting). You may have this done every 1-3 years. Abdominal aortic aneurysm (AAA) screening. You may need this if you are a current or former smoker. Osteoporosis. You may be screened starting at  age 20 if you are at high risk. Talk with your health care provider about your test results, treatment options, and if necessary, the need for more tests. Vaccines  Your health care provider may recommend certain vaccines, such as: Influenza vaccine. This is recommended every year. Tetanus, diphtheria, and acellular pertussis (Tdap, Td) vaccine. You may need a Td  booster every 10 years. Zoster vaccine. You may need this after age 64. Pneumococcal 13-valent conjugate (PCV13) vaccine. One dose is recommended after age 35. Pneumococcal polysaccharide (PPSV23) vaccine. One dose is recommended after age 10. Talk to your health care provider about which screenings and vaccines you need and how often you need them. This information is not intended to replace advice given to you by your health care provider. Make sure you discuss any questions you have with your health care provider. Document Released: 01/07/2016 Document Revised: 08/30/2016 Document Reviewed: 10/12/2015 Elsevier Interactive Patient Education  2017 [place] Prevention in the Home Falls can cause injuries. They can happen to people of all ages. There are many things you can do to make your home safe and to help prevent falls. What can I do on the outside of my home? Regularly fix the edges of walkways and driveways and fix any cracks. Remove anything that might make you trip as you walk through a door, such as a raised step or threshold. Trim any bushes or trees on the path to your home. Use bright outdoor lighting. Clear any walking paths of anything that might make someone trip, such as rocks or tools. Regularly check to see if handrails are loose or broken. Make sure that both sides of any steps have handrails. Any raised decks and porches should have guardrails on the edges. Have any leaves, snow, or ice cleared regularly. Use sand or salt on walking paths during winter. Clean up any spills in your garage right away. This includes oil or grease spills. What can I do in the bathroom? Use night lights. Install grab bars by the toilet and in the tub and shower. Do not use towel bars as grab bars. Use non-skid mats or decals in the tub or shower. If you need to sit down in the shower, use a plastic, non-slip stool. Keep the floor dry. Clean up any water that spills on the floor  as soon as it happens. Remove soap buildup in the tub or shower regularly. Attach bath mats securely with double-sided non-slip rug tape. Do not have throw rugs and other things on the floor that can make you trip. What can I do in the bedroom? Use night lights. Make sure that you have a light by your bed that is easy to reach. Do not use any sheets or blankets that are too big for your bed. They should not hang down onto the floor. Have a firm chair that has side arms. You can use this for support while you get dressed. Do not have throw rugs and other things on the floor that can make you trip. What can I do in the kitchen? Clean up any spills right away. Avoid walking on wet floors. Keep items that you use a lot in easy-to-reach places. If you need to reach something above you, use a strong step stool that has a grab bar. Keep electrical cords out of the way. Do not use floor polish or wax that makes floors slippery. If you must use wax, use non-skid floor wax. Do not have throw rugs and  other things on the floor that can make you trip. What can I do with my stairs? Do not leave any items on the stairs. Make sure that there are handrails on both sides of the stairs and use them. Fix handrails that are broken or loose. Make sure that handrails are as long as the stairways. Check any carpeting to make sure that it is firmly attached to the stairs. Fix any carpet that is loose or worn. Avoid having throw rugs at the top or bottom of the stairs. If you do have throw rugs, attach them to the floor with carpet tape. Make sure that you have a light switch at the top of the stairs and the bottom of the stairs. If you do not have them, ask someone to add them for you. What else can I do to help prevent falls? Wear shoes that: Do not have high heels. Have rubber bottoms. Are comfortable and fit you well. Are closed at the toe. Do not wear sandals. If you use a stepladder: Make sure that it is  fully opened. Do not climb a closed stepladder. Make sure that both sides of the stepladder are locked into place. Ask someone to hold it for you, if possible. Clearly mark and make sure that you can see: Any grab bars or handrails. First and last steps. Where the edge of each step is. Use tools that help you move around (mobility aids) if they are needed. These include: Canes. Walkers. Scooters. Crutches. Turn on the lights when you go into a dark area. Replace any light bulbs as soon as they burn out. Set up your furniture so you have a clear path. Avoid moving your furniture around. If any of your floors are uneven, fix them. If there are any pets around you, be aware of where they are. Review your medicines with your doctor. Some medicines can make you feel dizzy. This can increase your chance of falling. Ask your doctor what other things that you can do to help prevent falls. This information is not intended to replace advice given to you by your health care provider. Make sure you discuss any questions you have with your health care provider. Document Released: 10/07/2009 Document Revised: 05/18/2016 Document Reviewed: 01/15/2015 Elsevier Interactive Patient Education  2017 [employer].

## 2023-02-13 NOTE — Progress Notes (Addendum)
I connected with [name] today by telephone and verified that I am speaking with the correct person using two identifiers. Location patient: home Location provider: work Persons participating in the virtual visit: patient, provider.   I discussed the limitations, risks, security and privacy concerns of performing an evaluation and management service by telephone and the availability of in person appointments. I also discussed with the patient that there may be a patient responsible charge related to this service. The patient expressed understanding and verbally consented to this telephonic visit.    Interactive audio and video telecommunications were attempted between this provider and patient, however failed, due to patient having technical difficulties OR patient did not have access to video capability.  We continued and completed visit with audio only.  Some vital signs may be absent or patient reported.   Time Spent with patient on telephone encounter: 30 minutes  Subjective:   Steven Carpenter is a 67 y.o. male who presents for Medicare Annual/Subsequent preventive examination.  Review of Systems     Cardiac Risk Factors include: advanced age (>26mn, >>73women);dyslipidemia;family history of premature cardiovascular disease;male gender;hypertension     Objective:    Today's Vitals   02/13/23 0847  Weight: 180 lb (81.6 kg)  Height: '5\' 8"'$  (1.727 m)  PainSc: 0-No pain   Body mass index is 27.37 kg/m.     02/13/2023    8:49 AM 08/18/2021    9:36 AM 01/06/2021   12:40 PM 01/09/2017   10:16 AM  Advanced Directives  Does Patient Have a Medical Advance Directive? No No No No  Would patient like information on creating a medical advance directive? No - Patient declined No - Patient declined No - Patient declined     Current Medications (verified) Outpatient Encounter Medications as of 02/13/2023  Medication Sig   amLODipine (NORVASC) 5 MG tablet TAKE 2 TABLETS DAILY    atorvastatin (LIPITOR) 10 MG tablet TAKE 1 TABLET BY MOUTH EVERY DAY   valsartan (DIOVAN) 320 MG tablet TAKE 1 TABLET DAILY   No facility-administered encounter medications on file as of 02/13/2023.    Allergies (verified) Patient has no known allergies.   History: Past Medical History:  Diagnosis Date   Heart murmur    all of life   Hx of adenomatous polyp of colon 02/01/2017   Hyperlipidemia    Hypertension    Past Surgical History:  Procedure Laterality Date   APPENDECTOMY     EXTRACORPOREAL SHOCK WAVE LITHOTRIPSY Left 01/06/2021   Procedure: EXTRACORPOREAL SHOCK WAVE LITHOTRIPSY (ESWL);  Surgeon: WCeasar Mons MD;  [facility]  Service: Urology;  Laterality: Left;   INGUINAL HERNIA REPAIR  08/06/12   bilateral   Family History  Problem Relation Age of Onset   Hypertension Mother    Cancer Mother        breast   Hypertension Father    Colon cancer Neg Hx    Esophageal cancer Neg Hx    Rectal cancer Neg Hx    Stomach cancer Neg Hx    Colon polyps Neg Hx    Social History   Socioeconomic History   Marital status: Widowed    Spouse name: Not on file   Number of children: Not on file   Years of education: Not on file   Highest education level: Not on file  Occupational History   Not on file  Tobacco Use   Smoking status: Never   Smokeless tobacco: Never  Vaping Use  Vaping Use: Never used  Substance and Sexual Activity   Alcohol use: Yes    Alcohol/week: 1.0 - 2.0 standard drink of alcohol    Types: 1 - 2 Cans of beer per week    Comment: beer a few times per week   Drug use: No   Sexual activity: Yes    Partners: Female  Other Topics Concern   Not on file  Social History Narrative   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   Social Determinants of Health   Financial Resource Strain: Low Risk  (02/13/2023)   Overall Financial Resource Strain (CARDIA)    Difficulty of Paying Living  Expenses: Not hard at all  Food Insecurity: No Food Insecurity (02/13/2023)   Hunger Vital Sign    Worried About Running Out of Food in the Last Year: Never true    [place] in the Last Year: Never true  Transportation Needs: No Transportation Needs (02/13/2023)   PRAPARE - Hydrologist (Medical): No    Lack of Transportation (Non-Medical): No  Physical Activity: Sufficiently Active (02/13/2023)   Exercise Vital Sign    Days of Exercise per Week: 7 days    Minutes of Exercise per Session: 30 min  Stress: No Stress Concern Present (02/13/2023)   [place]    Feeling of Stress : Not at all  Social Connections: Unknown (02/13/2023)   Social Connection and Isolation Panel [NHANES]    Frequency of Communication with Friends and Family: More than three times a week    Frequency of Social Gatherings with Friends and Family: More than three times a week    Attends Religious Services: Patient refused    Active Member of Clubs or Organizations: Yes    Attends Archivist Meetings: More than 4 times per year    Marital Status: Widowed    Tobacco Counseling Counseling given: Not Answered   Clinical Intake:  Pre-visit preparation completed: Yes  Pain : No/denies pain Pain Score: 0-No pain     BMI - recorded: 27.37 Nutritional Status: BMI 25 -29 Overweight Nutritional Risks: None Diabetes: No  How often do you need to have someone help you when you read instructions, pamphlets, or other written materials from your doctor or pharmacy?: 1 - Never What is the last grade level you completed in school?: HSG  Diabetic? No  Interpreter Needed?: No  Information entered by :: Lisette Abu, LPN.   Activities of Daily Living    02/13/2023    8:57 AM  In your present state of health, do you have any difficulty performing the following activities:  Hearing? 0  Vision? 0   Difficulty concentrating or making decisions? 0  Walking or climbing stairs? 0  Dressing or bathing? 0  Doing errands, shopping? 0  Preparing Food and eating ? N  Using the Toilet? N  In the past six months, have you accidently leaked urine? N  Do you have problems with loss of bowel control? N  Managing your Medications? N  Managing your Finances? N  Housekeeping or managing your Housekeeping? N    Patient Care Team: Cassandria Anger, MD as PCP - General Lovena Neighbours Conception Oms, MD as Consulting Physician (Urology) Jola Schmidt, MD as Consulting Physician (Ophthalmology)  Indicate any recent Medical Services you may have received from other than [facility] providers in the past year (date may be  approximate).     Assessment:   This is a routine wellness examination for [employer].  Hearing/Vision screen Hearing Screening - Comments:: Denies hearing difficulties   Vision Screening - Comments:: Wears rx glasses - up to date with routine eye exams with Jola Schmidt, MD.   Dietary issues and exercise activities discussed: Current Exercise Habits: Home exercise routine, Type of exercise: walking;Other - see comments (During the winter months in [place]: kayaking, boat marathon, walking, water skiing, etc.), Time (Minutes): 60, Frequency (Times/Week): 7, Weekly Exercise (Minutes/Week): 420, Intensity: Moderate, Exercise limited by: None identified   Goals Addressed   None   Depression Screen    02/13/2023    8:53 AM 08/23/2022    9:27 AM 08/18/2021    9:53 AM 08/18/2021    9:34 AM 12/30/2020    4:09 PM 10/20/2019    8:07 AM  PHQ 2/9 Scores  PHQ - 2 Score 0 0 0 0 0 0  PHQ- 9 Score  0 0       Fall Risk    02/13/2023    8:50 AM 08/23/2022    9:27 AM 08/18/2021    9:53 AM 08/18/2021    9:37 AM 06/16/2021    9:12 AM  Fall Risk   Falls in the past year? 0 0 0 0 0  Number falls in past yr: 0 0 0 0 0  Injury with Fall? 0 0 0 0 0  Risk for fall due to : No Fall Risks No Fall Risks   No Fall Risks No Fall Risks  Follow up Falls prevention discussed   Falls evaluation completed     FALL RISK PREVENTION PERTAINING TO THE HOME:  Any stairs in or around the home? Yes  If so, are there any without handrails? No  Home free of loose throw rugs in walkways, pet beds, electrical cords, etc? Yes  Adequate lighting in your home to reduce risk of falls? Yes   ASSISTIVE DEVICES UTILIZED TO PREVENT FALLS:  Life alert? No  Use of a cane, walker or w/c? No  Grab bars in the bathroom? No  Shower chair or bench in shower? Yes  Elevated toilet seat or a handicapped toilet? Yes   TIMED UP AND GO:  Was the test performed? No . Telephonic Visit  Cognitive Function:        02/13/2023    8:51 AM  6CIT Screen  What Year? 0 points  What month? 0 points  What time? 0 points  Count back from 20 0 points  Months in reverse 0 points  Repeat phrase 0 points  Total Score 0 points    Immunizations Immunization History  Administered Date(s) Administered   Influenza Whole 10/05/2008, 10/22/2009, 11/13/2011, 08/25/2012   Influenza, High Dose Seasonal PF 09/26/2022   Influenza,inj,Quad PF,6+ Mos 12/15/2013, 10/24/2016, 10/04/2017, 10/17/2018, 10/20/2019   Influenza-Unspecified 08/25/2014, 09/25/2015   PFIZER(Purple Top)SARS-COV-2 Vaccination 05/10/2020, 05/31/2020, 11/30/2020   PNEUMOCOCCAL CONJUGATE-20 08/23/2022   Pneumococcal Conjugate-13 08/10/2020   Pneumococcal Polysaccharide-23 09/26/2022   Td 10/22/2009   Tdap 08/10/2020   Zoster, Live 10/24/2016    TDAP status: Up to date  Flu Vaccine status: Up to date  Pneumococcal vaccine status: Up to date  Covid-19 vaccine status: Completed vaccines  Qualifies for Shingles Vaccine? Yes   Zostavax completed Yes   Shingrix Completed?: No.    Education has been provided regarding the importance of this vaccine. Patient has been advised to call insurance company to determine out of pocket expense if  they have not yet  received this vaccine. Advised may also receive vaccine at local pharmacy or Health Dept. Verbalized acceptance and understanding.  Screening Tests Health Maintenance  Topic Date Due   Zoster Vaccines- Shingrix (1 of 2) Never done   COVID-19 Vaccine (4 - 2023-24 season) 08/25/2022   Medicare Annual Wellness (AWV)  02/14/2024   COLONOSCOPY (Pts 45-67yr Insurance coverage will need to be confirmed)  07/14/2025   DTaP/Tdap/Td (3 - Td or Tdap) 08/10/2030   Pneumonia Vaccine 68 Years old  Completed   INFLUENZA VACCINE  Completed   Hepatitis C Screening  Completed   HPV VACCINES  Aged Out    Health Maintenance  Health Maintenance Due  Topic Date Due   Zoster Vaccines- Shingrix (1 of 2) Never done   COVID-19 Vaccine (4 - 2023-24 season) 08/25/2022    Colorectal cancer screening: Type of screening: Colonoscopy. Completed 07/14/2020. Repeat every 5 years  Lung Cancer Screening: (Low Dose CT Chest recommended if Age 68-80years, 30 pack-year currently smoking OR have quit w/in 15years.) does not qualify.   Lung Cancer Screening Referral: No  Additional Screening:  Hepatitis C Screening: does qualify; Completed 04/01/2016  Vision Screening: Recommended annual ophthalmology exams for early detection of glaucoma and other disorders of the eye. Is the patient up to date with their annual eye exam?  Yes  Who is the provider or what is the name of the office in which the patient attends annual eye exams? BJola Schmidt MD. If pt is not established with a provider, would they like to be referred to a provider to establish care? No .   Dental Screening: Recommended annual dental exams for proper oral hygiene  Community Resource Referral / Chronic Care Management: CRR required this visit?  No   CCM required this visit?  No      Plan:     I have personally reviewed and noted the following in the patient's chart:   Medical and social history Use of alcohol, tobacco or illicit drugs   Current medications and supplements including opioid prescriptions. Patient is not currently taking opioid prescriptions. Functional ability and status Nutritional status Physical activity Advanced directives List of other physicians Hospitalizations, surgeries, and ER visits in previous 12 months Vitals Screenings to include cognitive, depression, and falls Referrals and appointments  In addition, I have reviewed and discussed with patient certain preventive protocols, quality metrics, and best practice recommendations. A written personalized care plan for preventive services as well as general preventive health recommendations were provided to patient.     SSheral Flow [place]  [id number]  Nurse Notes: Patient provider weight for this visit. Normal cognitive status assessed by direct observation by this Nurse Health Advisor. No abnormalities found.   Medical screening examination/treatment/procedure(s) were performed by non-physician practitioner and as supervising physician I was immediately available for consultation/collaboration.  I agree with above. ALew Dawes MD

## 2023-03-28 ENCOUNTER — Other Ambulatory Visit: Payer: Self-pay | Admitting: *Deleted

## 2023-03-28 MED ORDER — ATORVASTATIN CALCIUM 10 MG PO TABS: 10.0000 mg | ORAL_TABLET | Freq: Every day | ORAL | 2 refills | Status: DC

## 2023-03-28 MED ORDER — VALSARTAN 320 MG PO TABS: 320.0000 mg | ORAL_TABLET | Freq: Every day | ORAL | 2 refills | Status: DC

## 2023-08-09 ENCOUNTER — Encounter (INDEPENDENT_AMBULATORY_CARE_PROVIDER_SITE_OTHER): Payer: Self-pay

## 2023-08-21 ENCOUNTER — Encounter: Payer: Self-pay | Admitting: Internal Medicine

## 2023-08-23 ENCOUNTER — Other Ambulatory Visit: Payer: Self-pay | Admitting: Internal Medicine

## 2023-08-23 DIAGNOSIS — N320 Bladder-neck obstruction: Secondary | ICD-10-CM

## 2023-08-23 DIAGNOSIS — I10 Essential (primary) hypertension: Secondary | ICD-10-CM

## 2023-08-23 DIAGNOSIS — N200 Calculus of kidney: Secondary | ICD-10-CM

## 2023-08-23 DIAGNOSIS — E785 Hyperlipidemia, unspecified: Secondary | ICD-10-CM

## 2023-08-24 ENCOUNTER — Other Ambulatory Visit (INDEPENDENT_AMBULATORY_CARE_PROVIDER_SITE_OTHER): Payer: Medicare Other

## 2023-08-24 DIAGNOSIS — N320 Bladder-neck obstruction: Secondary | ICD-10-CM

## 2023-08-24 DIAGNOSIS — E785 Hyperlipidemia, unspecified: Secondary | ICD-10-CM | POA: Diagnosis not present

## 2023-08-24 DIAGNOSIS — I10 Essential (primary) hypertension: Secondary | ICD-10-CM

## 2023-08-24 DIAGNOSIS — N200 Calculus of kidney: Secondary | ICD-10-CM | POA: Diagnosis not present

## 2023-08-24 LAB — COMPREHENSIVE METABOLIC PANEL
ALT: 22 U/L (ref 0–53)
AST: 20 U/L (ref 0–37)
Albumin: 4.2 g/dL (ref 3.5–5.2)
Alkaline Phosphatase: 82 U/L (ref 39–117)
BUN: 22 mg/dL (ref 6–23)
CO2: 27 mEq/L (ref 19–32)
Calcium: 9.1 mg/dL (ref 8.4–10.5)
Chloride: 105 mEq/L (ref 96–112)
Creatinine, Ser: 0.8 mg/dL (ref 0.40–1.50)
GFR: 90.88 mL/min (ref >60.00)
Glucose, Bld: 90 mg/dL (ref 70–99)
Potassium: 3.8 mEq/L (ref 3.5–5.1)
Sodium: 142 mEq/L (ref 135–145)
Total Bilirubin: 0.3 mg/dL (ref 0.2–1.2)
Total Protein: 6.6 g/dL (ref 6.0–8.3)

## 2023-08-24 LAB — LIPID PANEL
Cholesterol: 177 mg/dL (ref 0–200)
HDL: 66.6 mg/dL (ref >39.00)
LDL Cholesterol: 99 mg/dL (ref 0–99)
NonHDL: 110.46
Total CHOL/HDL Ratio: 3
Triglycerides: 55 mg/dL (ref 0.0–149.0)
VLDL: 11 mg/dL (ref 0.0–40.0)

## 2023-08-24 LAB — URINALYSIS
Bilirubin Urine: NEGATIVE
Hgb urine dipstick: NEGATIVE
Ketones, ur: NEGATIVE
Leukocytes,Ua: NEGATIVE
Nitrite: NEGATIVE
Specific Gravity, Urine: >=1.03 — AB (ref 1.000–1.030)
Total Protein, Urine: NEGATIVE
Urine Glucose: NEGATIVE
Urobilinogen, UA: 0.2 (ref 0.0–1.0)
pH: 6 (ref 5.0–8.0)

## 2023-08-24 LAB — CBC WITH DIFFERENTIAL/PLATELET
Basophils Absolute: 0 10*3/uL (ref 0.0–0.1)
Basophils Relative: 0.7 % (ref 0.0–3.0)
Eosinophils Absolute: 0.1 10*3/uL (ref 0.0–0.7)
Eosinophils Relative: 2.5 % (ref 0.0–5.0)
HCT: 43.3 % (ref 39.0–52.0)
Hemoglobin: 14.2 g/dL (ref 13.0–17.0)
Lymphocytes Relative: 28.1 % (ref 12.0–46.0)
Lymphs Abs: 1.6 10*3/uL (ref 0.7–4.0)
MCHC: 32.9 g/dL (ref 30.0–36.0)
MCV: 89.7 fl (ref 78.0–100.0)
Monocytes Absolute: 0.4 10*3/uL (ref 0.1–1.0)
Monocytes Relative: 7.7 % (ref 3.0–12.0)
Neutro Abs: 3.5 10*3/uL (ref 1.4–7.7)
Neutrophils Relative %: 61 % (ref 43.0–77.0)
Platelets: 189 10*3/uL (ref 150.0–400.0)
RBC: 4.83 Mil/uL (ref 4.22–5.81)
RDW: 14.3 % (ref 11.5–15.5)
WBC: 5.8 10*3/uL (ref 4.0–10.5)

## 2023-08-24 LAB — PSA: PSA: 2.86 ng/mL (ref 0.10–4.00)

## 2023-08-24 LAB — TSH: TSH: 2.13 u[IU]/mL (ref 0.35–5.50)

## 2023-08-29 ENCOUNTER — Ambulatory Visit: Payer: Medicare Other | Admitting: Internal Medicine

## 2023-09-05 ENCOUNTER — Other Ambulatory Visit: Payer: Self-pay | Admitting: Internal Medicine

## 2023-09-11 ENCOUNTER — Encounter: Payer: Self-pay | Admitting: Internal Medicine

## 2023-09-11 ENCOUNTER — Ambulatory Visit (INDEPENDENT_AMBULATORY_CARE_PROVIDER_SITE_OTHER): Payer: Medicare Other | Admitting: Internal Medicine

## 2023-09-11 VITALS — BP 124/84 | HR 67 | Temp 98.7°F | Ht 68.0 in | Wt 191.2 lb

## 2023-09-11 DIAGNOSIS — Z0000 Encounter for general adult medical examination without abnormal findings: Secondary | ICD-10-CM

## 2023-09-11 DIAGNOSIS — E785 Hyperlipidemia, unspecified: Secondary | ICD-10-CM

## 2023-09-11 DIAGNOSIS — I10 Essential (primary) hypertension: Secondary | ICD-10-CM | POA: Diagnosis not present

## 2023-09-11 DIAGNOSIS — R972 Elevated prostate specific antigen [PSA]: Secondary | ICD-10-CM

## 2023-09-11 DIAGNOSIS — Z23 Encounter for immunization: Secondary | ICD-10-CM

## 2023-09-11 NOTE — Progress Notes (Signed)
Subjective:  Patient ID: Steven Carpenter, male    DOB: March 19, 1956  Age: 67 y.o. MRN: [id number]  CC: No chief complaint on file.   HPI Mj Walt presents for a well exam Follow-up 1 elevated PSA, dyslipidemia, hypertension  Outpatient Medications Prior to Visit  Medication Sig Dispense Refill   amLODipine (NORVASC) 5 MG tablet TAKE 2 TABLETS DAILY 180 tablet 3   atorvastatin (LIPITOR) 10 MG tablet Take 1 tablet (10 mg total) by mouth daily. 90 tablet 2   tamsulosin (FLOMAX) 0.4 MG CAPS capsule Take 0.4 mg by mouth daily.     valsartan (DIOVAN) 320 MG tablet Take 1 tablet (320 mg total) by mouth daily. 90 tablet 2   No facility-administered medications prior to visit.    ROS: Review of Systems  Constitutional:  Negative for appetite change, fatigue and unexpected weight change.  HENT:  Negative for congestion, nosebleeds, sneezing, sore throat and trouble swallowing.   Eyes:  Negative for itching and visual disturbance.  Respiratory:  Negative for cough.   Cardiovascular:  Negative for chest pain, palpitations and leg swelling.  Gastrointestinal:  Negative for abdominal distention, blood in stool, diarrhea and nausea.  Genitourinary:  Negative for frequency and hematuria.  Musculoskeletal:  Negative for back pain, gait problem, joint swelling and neck pain.  Skin:  Negative for rash.  Neurological:  Negative for dizziness, tremors, speech difficulty and weakness.  Psychiatric/Behavioral:  Negative for agitation, dysphoric mood and sleep disturbance. The patient is not nervous/anxious.     Objective:  BP 124/84 (BP Location: Left Arm, Patient Position: Sitting, Cuff Size: Normal)   Pulse 67   Temp 98.7 F (37.1 C) (Oral)   Ht 5\' 8"  (1.727 m)   Wt 191 lb 3.2 oz (86.7 kg)   SpO2 96%   BMI 29.07 kg/m   BP Readings from Last 3 Encounters:  09/11/23 124/84  08/23/22 128/72  08/18/21 126/80    Wt Readings from Last 3 Encounters:  09/11/23 191 lb 3.2 oz (86.7 kg)   02/13/23 180 lb (81.6 kg)  08/23/22 189 lb 6.4 oz (85.9 kg)    Physical Exam Constitutional:      General: He is not in acute distress.    Appearance: He is well-developed.     Comments: NAD  Eyes:     Conjunctiva/sclera: Conjunctivae normal.     Pupils: Pupils are equal, round, and reactive to light.  Neck:     Thyroid: No thyromegaly.     Vascular: No JVD.  Cardiovascular:     Rate and Rhythm: Normal rate and regular rhythm.     Heart sounds: Normal heart sounds. No murmur heard.    No friction rub. No gallop.  Pulmonary:     Effort: Pulmonary effort is normal. No respiratory distress.     Breath sounds: Normal breath sounds. No wheezing or rales.  Chest:     Chest wall: No tenderness.  Abdominal:     General: Bowel sounds are normal. There is no distension.     Palpations: Abdomen is soft. There is no mass.     Tenderness: There is no abdominal tenderness. There is no guarding or rebound.  Musculoskeletal:        General: No tenderness. Normal range of motion.     Cervical back: Normal range of motion.  Lymphadenopathy:     Cervical: No cervical adenopathy.  Skin:    General: Skin is warm and dry.     Findings: No rash.  Neurological:  Mental Status: He is alert and oriented to person, place, and time.     Cranial Nerves: No cranial nerve deficit.     Motor: No abnormal muscle tone.     Coordination: Coordination normal.     Gait: Gait normal.     Deep Tendon Reflexes: Reflexes are normal and symmetric.  Psychiatric:        Behavior: Behavior normal.        Thought Content: Thought content normal.        Judgment: Judgment normal.   Rectal-Per urology  Lab Results  Component Value Date   WBC 5.8 08/24/2023   HGB 14.2 08/24/2023   HCT 43.3 08/24/2023   PLT 189.0 08/24/2023   GLUCOSE 90 08/24/2023   CHOL 177 08/24/2023   TRIG 55.0 08/24/2023   HDL 66.60 08/24/2023   LDLDIRECT 176.9 12/15/2013   LDLCALC 99 08/24/2023   ALT 22 08/24/2023   AST 20  08/24/2023   NA 142 08/24/2023   K 3.8 08/24/2023   CL 105 08/24/2023   CREATININE 0.80 08/24/2023   BUN 22 08/24/2023   CO2 27 08/24/2023   TSH 2.13 08/24/2023   PSA 2.86 08/24/2023    DG Abd 1 View  Result Date: 01/06/2021 CLINICAL DATA:  Left ureteral calculus EXAM: ABDOMEN - 1 VIEW COMPARISON:  01/04/2021 FINDINGS: Left ureteral calculus is not definitely seen. There is question of a small area of increased density overlying the left L3 transverse process where the stone was previously present. Normal bowel gas pattern. No acute osseous abnormality. IMPRESSION: Left ureteral calculus is not definitely seen but may be unchanged in location at the L3 level neck. Electronically Signed   By: Guadlupe Spanish M.D.   On: 01/06/2021 13:46    Assessment & Plan:   Problem List Items Addressed This Visit     Well adult exam - Primary    We discussed age appropriate health related issues, including available/recomended screening tests and vaccinations. We discussed a need for adhering to healthy diet and exercise. Labs were ordered to be later reviewed . All questions were answered. Flight exam form was filled out 8/22 A  cardiac CT scan for calcium scoring offered 10/20, 8/22 Derm appt q/12 mo for [place] [place] has a Immunologist,  he spends win[place] in the [place] Due [place] in 2026 Dr Leone Payor      Hypertension    Continue on Diovan and Amlodipine      Dyslipidemia    On Lipitor      Elevated PSA    Negative w/up w/Urology 2024      Other Visit Diagnoses     Need for vaccination       Relevant Orders   Flu Vaccine Trivalent High Dose (Fluad) (Completed)         No orders of the defined types were placed in this encounter.     Follow-up: Return in about 1 year (around 09/10/2024) for Wellness Exam.  Sonda Primes, MD

## 2023-09-11 NOTE — Assessment & Plan Note (Addendum)
We discussed age appropriate health related issues, including available/recomended screening tests and vaccinations. We discussed a need for adhering to healthy diet and exercise. Labs were ordered to be later reviewed . All questions were answered. Flight exam form was filled out 8/22 A  cardiac CT scan for calcium scoring offered 10/20, 8/22 Derm appt q/12 mo for [place] [place] has a Immunologist,  he spends winters in the [place] or [place] Due Colon in 2026 Dr Leone Payor

## 2023-09-11 NOTE — Assessment & Plan Note (Signed)
Negative w/up w/Urology 2024

## 2023-09-17 NOTE — Assessment & Plan Note (Signed)
Continue on Diovan and Amlodipine

## 2023-09-17 NOTE — Assessment & Plan Note (Signed)
On Lipitor 

## 2023-11-30 ENCOUNTER — Other Ambulatory Visit: Payer: Self-pay | Admitting: Internal Medicine

## 2024-02-18 ENCOUNTER — Ambulatory Visit (INDEPENDENT_AMBULATORY_CARE_PROVIDER_SITE_OTHER): Payer: Medicare Other

## 2024-02-18 VITALS — Ht 68.0 in | Wt 181.0 lb

## 2024-02-18 DIAGNOSIS — Z0000 Encounter for general adult medical examination without abnormal findings: Secondary | ICD-10-CM

## 2024-02-18 NOTE — Patient Instructions (Addendum)
 Steven Carpenter , Thank you for taking time to come for your Medicare Wellness Visit. I appreciate your ongoing commitment to your health goals. Please review the following plan we discussed and let me know if I can assist you in the future.   Referrals/Orders/Follow-Ups/Clinician Recommendations: Aim for 30 minutes of exercise or brisk walking, 6-8 glasses of water, and 5 servings of fruits and vegetables each day. Patient due for COVID vaccine.  This is a list of the screening recommended for you and due dates:  Health Maintenance  Topic Date Due   COVID-19 Vaccine (4 - 2024-25 season) 08/26/2023   Medicare Annual Wellness Visit  02/17/2025   Colon Cancer Screening  07/14/2025   DTaP/Tdap/Td vaccine (3 - Td or Tdap) 08/10/2030   Pneumonia Vaccine  Completed   Flu Shot  Completed   Hepatitis C Screening  Completed   HPV Vaccine  Aged Out   Zoster (Shingles) Vaccine  Discontinued    Advanced directives: (Provided) Advance directive discussed with you today. I have provided a copy for you to complete at home and have notarized. Once this is complete, please bring a copy in to our office so we can scan it into your chart.   Next Medicare Annual Wellness Visit scheduled for next year: Yes - 08/2025

## 2024-02-18 NOTE — Progress Notes (Signed)
 Subjective:  Please attest and cosign this visit due to patients primary care provider not being in the office at the time the visit was completed.  (Pt of Dr A. Plotnikov)   Steven Carpenter is a 68 y.o. who presents for a Medicare Wellness preventive visit.  Visit Complete: Virtual I connected with  Steven Carpenter on 02/18/24 by a audio enabled telemedicine application and verified that I am speaking with the correct person using two identifiers.  Patient Location: Home  Provider Location: Office/Clinic  I discussed the limitations of evaluation and management by telemedicine. The patient expressed understanding and agreed to proceed.  Vital Signs: Because this visit was a virtual/telehealth visit, some criteria may be missing or patient reported. Any vitals not documented were not able to be obtained and vitals that have been documented are patient reported.  VideoDeclined- This patient declined Librarian, academic. Therefore the visit was completed with audio only.  AWV Questionnaire: No: Patient Medicare AWV questionnaire was not completed prior to this visit.  Cardiac Risk Factors include: advanced age (>49men, >60 women);hypertension;male gender;dyslipidemia     Objective:    Today's Vitals   02/18/24 0905  Weight: 181 lb (82.1 kg)  Height: 5\' 8"  (1.727 m)   Body mass index is 27.52 kg/m.     02/18/2024    9:02 AM 02/13/2023    8:49 AM 08/18/2021    9:36 AM 01/06/2021   12:40 PM 01/09/2017   10:16 AM  Advanced Directives  Does Patient Have a Medical Advance Directive? No No No No No  Would patient like information on creating a medical advance directive? Yes (MAU/Ambulatory/Procedural Areas - Information given) No - Patient declined No - Patient declined No - Patient declined     Current Medications (verified) Outpatient Encounter Medications as of 02/18/2024  Medication Sig   amLODipine (NORVASC) 5 MG tablet TAKE 2 TABLETS DAILY   atorvastatin  (LIPITOR) 10 MG tablet TAKE 1 TABLET DAILY   tamsulosin (FLOMAX) 0.4 MG CAPS capsule Take 0.4 mg by mouth daily.   valsartan (DIOVAN) 320 MG tablet TAKE 1 TABLET DAILY   No facility-administered encounter medications on file as of 02/18/2024.    Allergies (verified) Patient has no known allergies.   History: Past Medical History:  Diagnosis Date   Heart murmur    all of life   Hx of adenomatous polyp of colon 02/01/2017   Hyperlipidemia    Hypertension    Past Surgical History:  Procedure Laterality Date   APPENDECTOMY     EXTRACORPOREAL SHOCK WAVE LITHOTRIPSY Left 01/06/2021   Procedure: EXTRACORPOREAL SHOCK WAVE LITHOTRIPSY (ESWL);  Surgeon: Rene Paci, MD;  Location: [facility];  Service: Urology;  Laterality: Left;   INGUINAL HERNIA REPAIR  08/06/12   bilateral   Family History  Problem Relation Age of Onset   Hypertension Mother    Cancer Mother        breast   Hypertension Father    Colon cancer Neg Hx    Esophageal cancer Neg Hx    Rectal cancer Neg Hx    Stomach cancer Neg Hx    Colon polyps Neg Hx    Social History   Socioeconomic History   Marital status: Widowed    Spouse name: Not on file   Number of children: Not on file   Years of education: Not on file   Highest education level: Not on file  Occupational History   Not on file  Tobacco  Use   Smoking status: Never   Smokeless tobacco: Never  Vaping Use   Vaping status: Never Used  Substance and Sexual Activity   Alcohol use: Yes    Alcohol/week: 1.0 - 2.0 standard drink of alcohol    Types: 1 - 2 Cans of beer per week    Comment: beer a few times per week   Drug use: No   Sexual activity: Yes    Partners: Female  Other Topics Concern   Not on file  Social History Narrative   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   Social Drivers of Health   Financial Resource Strain: Low Risk  (02/18/2024)   Overall Financial Resource Strain  (CARDIA)    Difficulty of Paying Living Expenses: Not hard at all  Food Insecurity: No Food Insecurity (02/18/2024)   Hunger Vital Sign    Worried About Running Out of Food in the Last Year: Never true    Ran Out of Food in the Last Year: Never true  Transportation Needs: No Transportation Needs (02/18/2024)   PRAPARE - Administrator, Civil Service (Medical): No    Lack of Transportation (Non-Medical): No  Physical Activity: Sufficiently Active (02/18/2024)   Exercise Vital Sign    Days of Exercise per Week: 4 days    Minutes of Exercise per Session: 120 min  Stress: No Stress Concern Present (02/18/2024)   [employer] of Occupational Health - Occupational Stress Questionnaire    Feeling of Stress : Not at all  Social Connections: Moderately Isolated (02/18/2024)   Social Connection and Isolation Panel [NHANES]    Frequency of Communication with Friends and Family: More than three times a week    Frequency of Social Gatherings with Friends and Family: More than three times a week    Attends Religious Services: Never    Database administrator or Organizations: Yes    Attends Banker Meetings: 1 to 4 times per year    Marital Status: Widowed    Tobacco Counseling Counseling given: Not Answered    Clinical Intake:  Pre-visit preparation completed: Yes  Pain : No/denies pain     BMI - recorded: 27.52 Nutritional Status: BMI 25 -29 Overweight Nutritional Risks: None Diabetes: No  How often do you need to have someone help you when you read instructions, pamphlets, or other written materials from your doctor or pharmacy?: 1 - Never  Interpreter Needed?: No  Information entered by :: Hassell Halim, CMA  Activities of Daily Living Completed 02/18/2024    02/18/2024    9:07 AM  In your present state of health, do you have any difficulty performing the following activities:  Hearing? 0  Vision? 0  Difficulty concentrating or making decisions? 0   Walking or climbing stairs? 0  Dressing or bathing? 0  Doing errands, shopping? 0  Preparing Food and eating ? N  Using the Toilet? N  In the past six months, have you accidently leaked urine? N  Do you have problems with loss of bowel control? N  Managing your Medications? N  Managing your Finances? N  Housekeeping or managing your Housekeeping? N    Patient Care Team: Tresa Garter, MD as PCP - General Liliane Shi Dorian Furnace, MD as Consulting Physician (Urology) Sinda Du, MD as Consulting Physician (Ophthalmology) Iva Boop, MD as Consulting Physician (Gastroenterology)  Indicate any recent Medical Services you may have received from other than [facility]  providers in the past year (date may be approximate).     Assessment:   This is a routine wellness examination for [employer].  Hearing/Vision screen Hearing Screening - Comments:: Denies hearing difficulties   Vision Screening - Comments:: Wears rx glasses - up to date with routine eye exams with Dr Cathey Endow   Goals Addressed               This Visit's Progress     Patient Stated (pt-stated)        Patient stated that he wants to lose weight (about 10l;bs) and continue exercising and maintain a good diet.       Depression Screen Completed 02/18/2024    02/18/2024    9:11 AM 09/11/2023   11:00 AM 02/13/2023    8:53 AM 08/23/2022    9:27 AM 08/18/2021    9:53 AM 08/18/2021    9:34 AM 12/30/2020    4:09 PM  PHQ 2/9 Scores  PHQ - 2 Score 0 0 0 0 0 0 0  PHQ- 9 Score    0 0      Fall Risk Completed 02/18/2024    02/18/2024    9:09 AM 09/11/2023   11:00 AM 02/13/2023    8:50 AM 08/23/2022    9:27 AM 08/18/2021    9:53 AM  Fall Risk   Falls in the past year? 0 0 0 0 0  Number falls in past yr: 0 0 0 0 0  Injury with Fall? 0 0 0 0 0  Risk for fall due to : No Fall Risks No Fall Risks No Fall Risks No Fall Risks   Follow up Falls prevention discussed;Falls evaluation completed Falls evaluation completed Falls  prevention discussed      MEDICARE RISK AT HOME: Completed 02/18/2024 Medicare Risk at Home Any stairs in or around the home?: Yes If so, are there any without handrails?: No Home free of loose throw rugs in walkways, pet beds, electrical cords, etc?: Yes Adequate lighting in your home to reduce risk of falls?: Yes Life alert?: No Use of a cane, walker or w/c?: No Grab bars in the bathroom?: No Shower chair or bench in shower?: Yes Elevated toilet seat or a handicapped toilet?: No  TIMED UP AND GO:  Was the test performed?  No  Cognitive Function: 6CIT completed        02/18/2024    9:13 AM 02/13/2023    8:51 AM  6CIT Screen  What Year? 0 points 0 points  What month? 0 points 0 points  What time? 0 points 0 points  Count back from 20 0 points 0 points  Months in reverse 0 points 0 points  Repeat phrase 0 points 0 points  Total Score 0 points 0 points    Immunizations Immunization History  Administered Date(s) Administered   Fluad Trivalent(High Dose 65+) 09/11/2023   Influenza Whole 10/05/2008, 10/22/2009, 11/13/2011, 08/25/2012   Influenza, High Dose Seasonal PF 09/26/2022   Influenza,inj,Quad PF,6+ Mos 12/15/2013, 10/24/2016, 10/04/2017, 10/17/2018, 10/20/2019   Influenza-Unspecified 08/25/2014, 09/25/2015   PFIZER(Purple Top)SARS-COV-2 Vaccination 05/10/2020, 05/31/2020, 11/30/2020   PNEUMOCOCCAL CONJUGATE-20 08/23/2022   Pneumococcal Conjugate-13 08/10/2020   Pneumococcal Polysaccharide-23 09/26/2022   Td 10/22/2009   Tdap 08/10/2020   Zoster, Live 10/24/2016    Screening Tests Health Maintenance  Topic Date Due   COVID-19 Vaccine (4 - 2024-25 season) 08/26/2023   Medicare Annual Wellness (AWV)  02/17/2025   Colonoscopy  07/14/2025   DTaP/Tdap/Td (3 - Td or  Tdap) 08/10/2030   Pneumonia Vaccine 17+ Years old  Completed   INFLUENZA VACCINE  Completed   Hepatitis C Screening  Completed   HPV VACCINES  Aged Out   Zoster Vaccines- Shingrix  Discontinued     Health Maintenance  Health Maintenance Due  Topic Date Due   COVID-19 Vaccine (4 - 2024-25 season) 08/26/2023   Health Maintenance Items Addressed: 02/18/2024. Pt declines the COVID vaccine.  Additional Screening:  Vision Screening: Recommended annual ophthalmology exams for early detection of glaucoma and other disorders of the eye. Seen Dr Cathey Endow in 2024  Dental Screening: Recommended annual dental exams for proper oral hygiene  Community Resource Referral / Chronic Care Management: CRR required this visit?  No   CCM required this visit?  No     Plan:     I have personally reviewed and noted the following in the patient's chart:   Medical and social history Use of alcohol, tobacco or illicit drugs  Current medications and supplements including opioid prescriptions. Patient is not currently taking opioid prescriptions. Functional ability and status Nutritional status Physical activity Advanced directives List of other physicians Hospitalizations, surgeries, and ER visits in previous 12 months Vitals Screenings to include cognitive, depression, and falls Referrals and appointments  In addition, I have reviewed and discussed with patient certain preventive protocols, quality metrics, and best practice recommendations. A written personalized care plan for preventive services as well as general preventive health recommendations were provided to patient.     Darreld Mclean, CMA   02/18/2024   After Visit Summary: (MyChart) Due to this being a telephonic visit, the after visit summary with patients personalized plan was offered to patient via MyChart   Notes: Nothing significant to report at this time.

## 2024-07-28 ENCOUNTER — Telehealth: Payer: Self-pay

## 2024-07-28 NOTE — Telephone Encounter (Signed)
 Copied from CRM [id number]. Topic: General - Other >> Jul 28, 2024  1:11 PM Burnard DEL wrote: Reason for CRM: [facility] called in regarding panoramic xray and they seen some calcified areas that may be related to calcifications in the carotid artery. They are going to fax the report over as well for provider to review.

## 2024-07-30 ENCOUNTER — Telehealth: Payer: Self-pay

## 2024-07-30 DIAGNOSIS — E785 Hyperlipidemia, unspecified: Secondary | ICD-10-CM

## 2024-07-30 NOTE — Telephone Encounter (Signed)
 Copied from CRM [id number]. Topic: Clinical - Request for Lab/Test Order >> Jul 30, 2024  9:50 AM Steven Carpenter wrote: Reason for CRM: Pt is calling in regards to wanting Dr. Garald to put an order in Ct coronary  calcium  scan of the heart. Pt is also asking if this scan will be covered under medicare. Please update patient when order is put in the chart.

## 2024-08-01 NOTE — Telephone Encounter (Signed)
 Most likely this test will not be covered by Medicare.  It was $99 before.  Now I hear that they maybe charging $150 for this test.  Let me know.  Thanks,

## 2024-08-01 NOTE — Telephone Encounter (Signed)
 Pt has stated he is willing to pay out pf pocket for this test. He asked for an order to be placed to have this done and if there is a number for him to schedule it?

## 2024-08-27 ENCOUNTER — Telehealth: Payer: Self-pay | Admitting: Radiology

## 2024-08-27 NOTE — Telephone Encounter (Signed)
 Copied from CRM [id number]. Topic: Clinical - Request for Lab/Test Order >> Jul 30, 2024  9:50 AM Carlyon D wrote: Reason for CRM: Pt is calling in regards to wanting Dr. Garald to put an order in Ct coronary  calcium  scan of the heart. Pt is also asking if this scan will be covered under medicare. Please update patient when order is put in the chart. >> Aug 27, 2024 10:02 AM Rosina BIRCH wrote: Patient called stating he would like an update on the message that was sent to the office on 8/6 regarding a referral [phone]

## 2024-08-28 NOTE — Telephone Encounter (Signed)
 Spoke to patient and relayed info about CT referral sent it. Sent MyChart message with information to set up appt.

## 2024-08-28 NOTE — Telephone Encounter (Addendum)
It is done.  Thanks 

## 2024-08-28 NOTE — Addendum Note (Signed)
 Addended by: Monick Rena V on: 08/28/2024 07:09 AM   Modules accepted: Orders

## 2024-09-01 ENCOUNTER — Telehealth: Payer: Self-pay

## 2024-09-01 ENCOUNTER — Other Ambulatory Visit: Payer: Self-pay | Admitting: Internal Medicine

## 2024-09-01 DIAGNOSIS — E785 Hyperlipidemia, unspecified: Secondary | ICD-10-CM

## 2024-09-01 DIAGNOSIS — R972 Elevated prostate specific antigen [PSA]: Secondary | ICD-10-CM

## 2024-09-01 DIAGNOSIS — Z0000 Encounter for general adult medical examination without abnormal findings: Secondary | ICD-10-CM

## 2024-09-01 DIAGNOSIS — I10 Essential (primary) hypertension: Secondary | ICD-10-CM

## 2024-09-01 NOTE — Telephone Encounter (Signed)
 Copied from CRM [id number]. Topic: Clinical - Request for Lab/Test Order >> Sep 01, 2024  7:53 AM Carlyon D wrote: Reason for CRM: Pt would like lab orders put in prior to his physical next week 9/18 that way he can discuss results at appt as well. Please reach out to pt once order is entered so he can call to schedule his lab visit.

## 2024-09-03 ENCOUNTER — Ambulatory Visit
Admission: RE | Admit: 2024-09-03 | Discharge: 2024-09-03 | Disposition: A | Discharge status: Home / Self Care | Source: Ambulatory Visit | Attending: Internal Medicine | Admitting: Internal Medicine

## 2024-09-03 DIAGNOSIS — E785 Hyperlipidemia, unspecified: Secondary | ICD-10-CM

## 2024-09-04 ENCOUNTER — Telehealth: Payer: Self-pay

## 2024-09-04 NOTE — Telephone Encounter (Signed)
 Imaging called about patient Cardiac CT results. Results in patient chart. PLEASE review

## 2024-09-07 ENCOUNTER — Ambulatory Visit: Payer: Self-pay | Admitting: Internal Medicine

## 2024-09-07 DIAGNOSIS — I2510 Atherosclerotic heart disease of native coronary artery without angina pectoris: Secondary | ICD-10-CM | POA: Insufficient documentation

## 2024-09-07 DIAGNOSIS — I2583 Coronary atherosclerosis due to lipid rich plaque: Secondary | ICD-10-CM

## 2024-09-07 DIAGNOSIS — I77810 Thoracic aortic ectasia: Secondary | ICD-10-CM | POA: Insufficient documentation

## 2024-09-07 DIAGNOSIS — I700 Atherosclerosis of aorta: Secondary | ICD-10-CM | POA: Insufficient documentation

## 2024-09-07 MED ORDER — ASPIRIN 81 MG PO TBEC: 81.0000 mg | DELAYED_RELEASE_TABLET | Freq: Every day | ORAL | Status: AC

## 2024-09-07 NOTE — Telephone Encounter (Signed)
 Okay.  Done.  Thanks

## 2024-09-09 NOTE — Progress Notes (Shared)
 [facility] - Clinic Note  09/22/2024   CHIEF COMPLAINT Patient presents for No chief complaint on file.  HISTORY OF PRESENT ILLNESS: Steven Carpenter is a 68 y.o. male who presents to the clinic today for:   Referring physician: Garald Karlynn [name], MD [address] [place],  [place] [address]  HISTORICAL INFORMATION:  Selected notes from the MEDICAL RECORD NUMBER Referred by Dr. JAMA:  Ocular Hx- PMH-   CURRENT MEDICATIONS: No current outpatient medications on file. (Ophthalmic Drugs)   No current facility-administered medications for this visit. (Ophthalmic Drugs)   Current Outpatient Medications (Other)  Medication Sig   amLODipine  (NORVASC ) 5 MG tablet TAKE 2 TABLETS DAILY   aspirin  EC 81 MG tablet Take 1 tablet (81 mg total) by mouth daily.   atorvastatin  (LIPITOR) 10 MG tablet TAKE 1 TABLET DAILY   tamsulosin  (FLOMAX ) 0.4 MG CAPS capsule Take 0.4 mg by mouth daily.   valsartan  (DIOVAN ) 320 MG tablet TAKE 1 TABLET DAILY   No current facility-administered medications for this visit. (Other)   REVIEW OF SYSTEMS:  ALLERGIES No Known Allergies PAST MEDICAL HISTORY Past Medical History:  Diagnosis Date   Heart murmur    all of life   Hx of adenomatous polyp of colon 02/01/2017   Hyperlipidemia    Hypertension    Past Surgical History:  Procedure Laterality Date   APPENDECTOMY     EXTRACORPOREAL SHOCK WAVE LITHOTRIPSY Left 01/06/2021   Procedure: EXTRACORPOREAL SHOCK WAVE LITHOTRIPSY (ESWL);  Surgeon: Devere Lonni Righter, MD;  Location: [facility];  Service: Urology;  Laterality: Left;   INGUINAL HERNIA REPAIR  08/06/12   bilateral   FAMILY HISTORY Family History  Problem Relation Age of Onset   Hypertension Mother    Cancer Mother        breast   Hypertension Father    Colon cancer Neg Hx    Esophageal cancer Neg Hx    Rectal cancer Neg Hx    Stomach cancer Neg Hx    Colon polyps Neg Hx    SOCIAL HISTORY Social  History   Tobacco Use   Smoking status: Never   Smokeless tobacco: Never  Vaping Use   Vaping status: Never Used  Substance Use Topics   Alcohol use: Yes    Alcohol/week: 1.0 - 2.0 standard drink of alcohol    Types: 1 - 2 Cans of beer per week    Comment: beer a few times per week   Drug use: No       OPHTHALMIC EXAM:  Not recorded    IMAGING AND PROCEDURES  Imaging and Procedures for 09/22/2024        ASSESSMENT/PLAN:   ICD-10-CM   1. Retinal edema of both eyes  H35.81      1.  2.  3.  Ophthalmic Meds Ordered this visit:  No orders of the defined types were placed in this encounter.    No follow-ups on file.  There are no Patient Instructions on file for this visit.  Explained the diagnoses, plan, and follow up with the patient and they expressed understanding.  Patient expressed understanding of the importance of proper follow up care.   This document serves as a record of services personally performed by [name], MD, PhD. It was created on their behalf by Avelina Pereyra, COA an ophthalmic technician. The creation of this record is the provider's dictation and/or activities during the visit.   Electronically signed by: Avelina  GORMAN Pereyra, COT  09/09/24  2:44 PM   [name], M.D., Ph.D. Diseases & Surgery of the Retina and Vitreous [facility] 09/22/2024  Abbreviations: M myopia (nearsighted); A astigmatism; H hyperopia (farsighted); P presbyopia; Mrx spectacle prescription;  CTL contact lenses; OD right eye; OS left eye; OU both eyes  XT exotropia; ET esotropia; PEK punctate epithelial keratitis; PEE punctate epithelial erosions; DES dry eye syndrome; MGD meibomian gland dysfunction; ATs artificial tears; PFAT's preservative free artificial tears; NSC nuclear sclerotic cataract; PSC posterior subcapsular cataract; ERM epi-retinal membrane; PVD posterior vitreous detachment; RD retinal detachment; DM diabetes mellitus; DR  diabetic retinopathy; NPDR non-proliferative diabetic retinopathy; PDR proliferative diabetic retinopathy; CSME clinically significant macular edema; DME diabetic macular edema; dbh dot blot hemorrhages; CWS cotton wool spot; POAG primary open angle glaucoma; C/D cup-to-disc ratio; HVF humphrey visual field; GVF goldmann visual field; OCT optical coherence tomography; IOP intraocular pressure; BRVO Branch retinal vein occlusion; CRVO central retinal vein occlusion; CRAO central retinal artery occlusion; BRAO branch retinal artery occlusion; RT retinal tear; SB scleral buckle; PPV pars plana vitrectomy; VH Vitreous hemorrhage; PRP panretinal laser photocoagulation; IVK intravitreal kenalog ; VMT vitreomacular traction; MH Macular hole;  NVD neovascularization of the disc; NVE neovascularization elsewhere; AREDS age related eye disease study; ARMD age related macular degeneration; POAG primary open angle glaucoma; EBMD epithelial/anterior basement membrane dystrophy; ACIOL anterior chamber intraocular lens; IOL intraocular lens; PCIOL posterior chamber intraocular lens; Phaco/IOL phacoemulsification with intraocular lens placement; PRK photorefractive keratectomy; LASIK laser assisted in situ keratomileusis; HTN hypertension; DM diabetes mellitus; COPD chronic obstructive pulmonary disease

## 2024-09-11 ENCOUNTER — Encounter: Payer: Self-pay | Admitting: Internal Medicine

## 2024-09-11 ENCOUNTER — Ambulatory Visit (INDEPENDENT_AMBULATORY_CARE_PROVIDER_SITE_OTHER): Payer: Medicare Other | Admitting: Internal Medicine

## 2024-09-11 VITALS — BP 126/80 | HR 64 | Temp 98.6°F | Ht 68.0 in | Wt 182.4 lb

## 2024-09-11 DIAGNOSIS — I10 Essential (primary) hypertension: Secondary | ICD-10-CM | POA: Diagnosis not present

## 2024-09-11 DIAGNOSIS — E785 Hyperlipidemia, unspecified: Secondary | ICD-10-CM

## 2024-09-11 DIAGNOSIS — I77810 Thoracic aortic ectasia: Secondary | ICD-10-CM | POA: Diagnosis not present

## 2024-09-11 DIAGNOSIS — Z23 Encounter for immunization: Secondary | ICD-10-CM

## 2024-09-11 DIAGNOSIS — Z0000 Encounter for general adult medical examination without abnormal findings: Secondary | ICD-10-CM

## 2024-09-11 DIAGNOSIS — R972 Elevated prostate specific antigen [PSA]: Secondary | ICD-10-CM

## 2024-09-11 DIAGNOSIS — I2583 Coronary atherosclerosis due to lipid rich plaque: Secondary | ICD-10-CM | POA: Diagnosis not present

## 2024-09-11 LAB — CBC WITH DIFFERENTIAL/PLATELET
Basophils Absolute: 0 K/uL (ref 0.0–0.1)
Basophils Relative: 0.6 % (ref 0.0–3.0)
Eosinophils Absolute: 0.1 K/uL (ref 0.0–0.7)
Eosinophils Relative: 1.3 % (ref 0.0–5.0)
HCT: 42.4 % (ref 39.0–52.0)
Hemoglobin: 14.1 g/dL (ref 13.0–17.0)
Lymphocytes Relative: 23.8 % (ref 12.0–46.0)
Lymphs Abs: 1.3 K/uL (ref 0.7–4.0)
MCHC: 33.2 g/dL (ref 30.0–36.0)
MCV: 87.9 fl (ref 78.0–100.0)
Monocytes Absolute: 0.4 K/uL (ref 0.1–1.0)
Monocytes Relative: 7.2 % (ref 3.0–12.0)
Neutro Abs: 3.7 K/uL (ref 1.4–7.7)
Neutrophils Relative %: 67.1 % (ref 43.0–77.0)
Platelets: 181 K/uL (ref 150.0–400.0)
RBC: 4.83 Mil/uL (ref 4.22–5.81)
RDW: 13.9 % (ref 11.5–15.5)
WBC: 5.6 K/uL (ref 4.0–10.5)

## 2024-09-11 LAB — TSH: TSH: 2.94 u[IU]/mL (ref 0.35–5.50)

## 2024-09-11 LAB — PSA: PSA: 2.5 ng/mL (ref 0.10–4.00)

## 2024-09-11 NOTE — Assessment & Plan Note (Signed)
 We discussed age appropriate health related issues, including available/recomended screening tests and vaccinations. We discussed a need for adhering to healthy diet and exercise. Labs were ordered to be later reviewed . All questions were answered. Flight exam form was filled out  A  cardiac CT scan for calcium  scoring offered 10/20, 8/22 Derm appt q/12 mo for AKs Oree has a Immunologist,  he spends winters in the [place] or [place] Due Colon in 2026 Dr Avram

## 2024-09-11 NOTE — Assessment & Plan Note (Signed)
 Check PSA. ?

## 2024-09-11 NOTE — Assessment & Plan Note (Signed)
 08/2024 coronary calcium  CT score is 367.  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  Aortic atherosclerosis Blood pressure control.  On atorvastatin  and aspirin .  BP control Cardiol ref was made

## 2024-09-11 NOTE — Progress Notes (Signed)
 Subjective:  Patient ID: Steven Carpenter, male    DOB: Nov 19, 1956  Age: 68 y.o. MRN: [id number]  CC: Follow-up and Annual Exam   HPI Steven Carpenter presents for  a well exam We need to discuss coronary calcium  CT findings, hypertension, dyslipidemia  Outpatient Medications Prior to Visit  Medication Sig Dispense Refill   amLODipine  (NORVASC ) 5 MG tablet TAKE 2 TABLETS DAILY 180 tablet 3   atorvastatin  (LIPITOR) 10 MG tablet TAKE 1 TABLET DAILY 90 tablet 3   tamsulosin  (FLOMAX ) 0.4 MG CAPS capsule Take 0.4 mg by mouth daily.     valsartan  (DIOVAN ) 320 MG tablet TAKE 1 TABLET DAILY 90 tablet 3   aspirin  EC 81 MG tablet Take 1 tablet (81 mg total) by mouth daily. (Patient not taking: Reported on 09/11/2024)     No facility-administered medications prior to visit.    ROS: Review of Systems  Constitutional:  Negative for appetite change, fatigue and unexpected weight change.  HENT:  Negative for congestion, nosebleeds, sneezing, sore throat and trouble swallowing.   Eyes:  Negative for itching and visual disturbance.  Respiratory:  Negative for cough.   Cardiovascular:  Negative for chest pain, palpitations and leg swelling.  Gastrointestinal:  Negative for abdominal distention, blood in stool, diarrhea and nausea.  Genitourinary:  Negative for frequency and hematuria.  Musculoskeletal:  Negative for back pain, gait problem, joint swelling and neck pain.  Skin:  Negative for rash.  Neurological:  Negative for dizziness, tremors, speech difficulty and weakness.  Psychiatric/Behavioral:  Negative for agitation, dysphoric mood, sleep disturbance and suicidal ideas. The patient is not nervous/anxious.     Objective:  BP 126/80   Pulse 64   Temp 98.6 F (37 C) (Oral)   Ht 5' 8 (1.727 m)   Wt 182 lb 6.4 oz (82.7 kg)   SpO2 96%   BMI 27.73 kg/m   BP Readings from Last 3 Encounters:  09/11/24 126/80  09/11/23 124/84  08/23/22 128/72    Wt Readings from Last 3 Encounters:  09/11/24  182 lb 6.4 oz (82.7 kg)  02/18/24 181 lb (82.1 kg)  09/11/23 191 lb 3.2 oz (86.7 kg)    Physical Exam Constitutional:      General: He is not in acute distress.    Appearance: He is well-developed. He is obese.     Comments: NAD  Eyes:     Conjunctiva/sclera: Conjunctivae normal.     Pupils: Pupils are equal, round, and reactive to light.  Neck:     Thyroid : No thyromegaly.     Vascular: No JVD.  Cardiovascular:     Rate and Rhythm: Normal rate and regular rhythm.     Heart sounds: Normal heart sounds. No murmur heard.    No friction rub. No gallop.  Pulmonary:     Effort: Pulmonary effort is normal. No respiratory distress.     Breath sounds: Normal breath sounds. No wheezing or rales.  Chest:     Chest wall: No tenderness.  Abdominal:     General: Bowel sounds are normal. There is no distension.     Palpations: Abdomen is soft. There is no mass.     Tenderness: There is no abdominal tenderness. There is no guarding or rebound.  Musculoskeletal:        General: No tenderness. Normal range of motion.     Cervical back: Normal range of motion.  Lymphadenopathy:     Cervical: No cervical adenopathy.  Skin:    General: Skin  is warm and dry.     Findings: No rash.  Neurological:     Mental Status: He is alert and oriented to person, place, and time.     Cranial Nerves: No cranial nerve deficit.     Motor: No abnormal muscle tone.     Coordination: Coordination normal.     Gait: Gait normal.     Deep Tendon Reflexes: Reflexes are normal and symmetric.  Psychiatric:        Behavior: Behavior normal.        Thought Content: Thought content normal.        Judgment: Judgment normal.    I spent 22 minutes in addition to time for CPX wellness examination in preparing to see the patient by review of recent labs, imaging , obtaining and reviewing separately obtained history, communicating with the patient, ordering medications, tests or procedures, and documenting clinical  information in the EHR including the differential diagnosis, treatment, and any further evaluation and other management of newly diagnosed CAD, aortic dilatation. We filled out a pilot health form.         Lab Results  Component Value Date   WBC 5.6 09/11/2024   HGB 14.1 09/11/2024   HCT 42.4 09/11/2024   PLT 181.0 09/11/2024   GLUCOSE 95 09/11/2024   CHOL 170 09/11/2024   TRIG 58.0 09/11/2024   HDL 71.60 09/11/2024   LDLDIRECT 176.9 12/15/2013   LDLCALC 86 09/11/2024   ALT 16 09/11/2024   AST 16 09/11/2024   NA 141 09/11/2024   K 3.9 09/11/2024   CL 104 09/11/2024   CREATININE 0.86 09/11/2024   BUN 16 09/11/2024   CO2 24 09/11/2024   TSH 2.94 09/11/2024   PSA 2.50 09/11/2024    CT CARDIAC SCORING ([facility] LOCATIONS ONLY) Result Date: 09/04/2024 CLINICAL DATA:  Elevated cholesterol. * Tracking Code: FCC * EXAM: CT CARDIAC CORONARY ARTERY CALCIUM  SCORE TECHNIQUE: Non-contrast imaging through the heart was performed using prospective ECG gating. Image post processing was performed on an independent workstation, allowing for quantitative analysis of the heart and coronary arteries. Note that this exam targets the heart and the chest was not imaged in its entirety. COMPARISON:  None available. FINDINGS: CORONARY CALCIUM  SCORES: Left Main: 0 LAD: 129 LCx: 20.6 RCA/PDA: 217 Total Agatston Score: 367 MESA database percentile: 69 AORTA MEASUREMENTS: Ascending Aorta: 4.1 cm Descending Aorta:3.0 cm OTHER FINDINGS: No pleural fluid.  Anterior right base scarring. Ascending aortic dilatation, 4.1 cm on coronal image 96. Aortic atherosclerosis. Normal heart size, without pericardial effusion. No imaged thoracic adenopathy. Mild right hemidiaphragm elevation. Normal imaged portions of the liver, spleen, stomach. No acute osseous abnormality. IMPRESSION: 1. Total Agatston score of 367, corresponding to 69th percentile for age, sex, and race based cohort. 2. Mild ascending aortic dilatation at 4.1 cm.  Recommend semi-annual imaging followup by CTA or MRA and referral to cardiothoracic surgery if not already obtained. This recommendation follows 2010 ACCF/AHA/AATS/ACR/ASA/SCA/SCAI/SIR/STS/SVM Guidelines for the Diagnosis and Management of Patients With Thoracic Aortic Disease. Circulation. 2010; 121: Z733-z630. Aortic aneurysm NOS (ICD10-I71.9) 3.  Aortic Atherosclerosis (ICD10-I70.0). These results will be called to the ordering clinician or representative by the Radiologist Assistant, and communication documented in the PACS or [employer]. Electronically Signed   By: Rockey Kilts M.D.   On: 09/04/2024 13:58    Assessment & Plan:   Problem List Items Addressed This Visit     Ascending aorta dilatation (HCC)   08/2024 coronary calcium  CT score is 367.  Ascending  aorta dilatation 4.1 cm.  Aortic atherosclerosis Blood pressure control.  On atorvastatin  and aspirin .  BP control Cardiol ref was made      Relevant Orders   Ambulatory referral to Cardiology   Coronary atherosclerosis   08/2024 coronary calcium  CT score is 367.  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  Aortic atherosclerosis Blood pressure control.  On atorvastatin  and aspirin .  BP control Cardiol ref was made      Relevant Orders   Ambulatory referral to Cardiology   Dyslipidemia   Elevated PSA   Check PSA      Hypertension   Well adult exam - Primary   We discussed age appropriate health related issues, including available/recomended screening tests and vaccinations. We discussed a need for adhering to healthy diet and exercise. Labs were ordered to be later reviewed . All questions were answered. Flight exam form was filled out  A  cardiac CT scan for calcium  scoring offered 10/20, 8/22 Derm appt q/12 mo for AKs Steven Carpenter has a Immunologist,  he spends winters in the [place] or [place] Due Colon in 2026 Dr Avram      Other Visit Diagnoses       Immunization due       Relevant Orders   Flu vaccine trivalent  PF, 6mos and older(Flulaval,Afluria,Fluarix,Fluzone) (Completed)         No orders of the defined types were placed in this encounter.     Follow-up: Return in about 1 year (around 09/11/2025) for Wellness Exam.  Marolyn Noel, MD

## 2024-09-12 LAB — COMPREHENSIVE METABOLIC PANEL WITH GFR
ALT: 16 U/L (ref 0–53)
AST: 16 U/L (ref 0–37)
Albumin: 4.5 g/dL (ref 3.5–5.2)
Alkaline Phosphatase: 69 U/L (ref 39–117)
BUN: 16 mg/dL (ref 6–23)
CO2: 24 meq/L (ref 19–32)
Calcium: 9.3 mg/dL (ref 8.4–10.5)
Chloride: 104 meq/L (ref 96–112)
Creatinine, Ser: 0.86 mg/dL (ref 0.40–1.50)
GFR: 88.26 mL/min (ref >60.00)
Glucose, Bld: 95 mg/dL (ref 70–99)
Potassium: 3.9 meq/L (ref 3.5–5.1)
Sodium: 141 meq/L (ref 135–145)
Total Bilirubin: 0.6 mg/dL (ref 0.2–1.2)
Total Protein: 6.8 g/dL (ref 6.0–8.3)

## 2024-09-12 LAB — LIPID PANEL
Cholesterol: 170 mg/dL (ref 0–200)
HDL: 71.6 mg/dL (ref >39.00)
LDL Cholesterol: 86 mg/dL (ref 0–99)
NonHDL: 97.98
Total CHOL/HDL Ratio: 2
Triglycerides: 58 mg/dL (ref 0.0–149.0)
VLDL: 11.6 mg/dL (ref 0.0–40.0)

## 2024-09-22 ENCOUNTER — Encounter (INDEPENDENT_AMBULATORY_CARE_PROVIDER_SITE_OTHER): Payer: Self-pay | Admitting: Ophthalmology

## 2024-09-22 DIAGNOSIS — H3581 Retinal edema: Secondary | ICD-10-CM

## 2024-09-25 ENCOUNTER — Encounter: Payer: Self-pay | Admitting: Internal Medicine

## 2024-09-26 ENCOUNTER — Encounter (INDEPENDENT_AMBULATORY_CARE_PROVIDER_SITE_OTHER): Payer: Self-pay | Admitting: Ophthalmology

## 2024-09-26 ENCOUNTER — Ambulatory Visit (INDEPENDENT_AMBULATORY_CARE_PROVIDER_SITE_OTHER): Payer: Self-pay | Admitting: Ophthalmology

## 2024-09-26 DIAGNOSIS — I10 Essential (primary) hypertension: Secondary | ICD-10-CM | POA: Diagnosis not present

## 2024-09-26 DIAGNOSIS — H25813 Combined forms of age-related cataract, bilateral: Secondary | ICD-10-CM | POA: Diagnosis not present

## 2024-09-26 DIAGNOSIS — H35033 Hypertensive retinopathy, bilateral: Secondary | ICD-10-CM

## 2024-09-26 DIAGNOSIS — H40003 Preglaucoma, unspecified, bilateral: Secondary | ICD-10-CM

## 2024-09-26 DIAGNOSIS — H35371 Puckering of macula, right eye: Secondary | ICD-10-CM | POA: Diagnosis not present

## 2024-09-26 NOTE — Progress Notes (Addendum)
 [facility] - Clinic Note  09/26/2024   CHIEF COMPLAINT Patient presents for Retina Evaluation  HISTORY OF PRESENT ILLNESS: Steven Carpenter is a 68 y.o. male who presents to the clinic today for:  HPI     Retina Evaluation   In right eye.  This started 3 weeks ago.  Duration of 3 weeks.  Associated Symptoms Flashes and Floaters.  I, the attending physician,  performed the HPI with the patient and updated documentation appropriately.        Comments   Retina eval per Dr Waylan for ERM w/ ret hole OD pt is reporting no vision changes noticed he has some flashes and floaters over the last several year       Last edited by Valdemar Rogue, MD on 09/26/2024 12:17 PM.    Patient states he is not having issues seeing with the right eye.  Referring physician: Waylan Cain, MD [address] [place],  [place] [address]  HISTORICAL INFORMATION:  Selected notes from the MEDICAL RECORD NUMBER Referred by Dr. Waylan  LEE: August 2025 Ocular Hx- ERM, POAG suspect PMH-   CURRENT MEDICATIONS: No current outpatient medications on file. (Ophthalmic Drugs)   No current facility-administered medications for this visit. (Ophthalmic Drugs)   Current Outpatient Medications (Other)  Medication Sig   amLODipine  (NORVASC ) 5 MG tablet TAKE 2 TABLETS DAILY   aspirin  EC 81 MG tablet Take 1 tablet (81 mg total) by mouth daily.   atorvastatin  (LIPITOR) 10 MG tablet TAKE 1 TABLET DAILY   tamsulosin  (FLOMAX ) 0.4 MG CAPS capsule Take 0.4 mg by mouth daily.   valsartan  (DIOVAN ) 320 MG tablet TAKE 1 TABLET DAILY   No current facility-administered medications for this visit. (Other)   REVIEW OF SYSTEMS: ROS   Positive for: Cardiovascular, Eyes Last edited by Resa Delon ORN, COT on 09/26/2024  8:38 AM.     ALLERGIES No Known Allergies PAST MEDICAL HISTORY Past Medical History:  Diagnosis Date   Heart murmur    all of life   Hx of adenomatous polyp of colon 02/01/2017    Hyperlipidemia    Hypertension    Past Surgical History:  Procedure Laterality Date   APPENDECTOMY     EXTRACORPOREAL SHOCK WAVE LITHOTRIPSY Left 01/06/2021   Procedure: EXTRACORPOREAL SHOCK WAVE LITHOTRIPSY (ESWL);  Surgeon: Devere Lonni Righter, MD;  Location: [facility];  Service: Urology;  Laterality: Left;   INGUINAL HERNIA REPAIR  08/06/12   bilateral   FAMILY HISTORY Family History  Problem Relation Age of Onset   Hypertension Mother    Cancer Mother        breast   Hypertension Father    Colon cancer Neg Hx    Esophageal cancer Neg Hx    Rectal cancer Neg Hx    Stomach cancer Neg Hx    Colon polyps Neg Hx    SOCIAL HISTORY Social History   Tobacco Use   Smoking status: Never   Smokeless tobacco: Never  Vaping Use   Vaping status: Never Used  Substance Use Topics   Alcohol use: Yes    Alcohol/week: 1.0 - 2.0 standard drink of alcohol    Types: 1 - 2 Cans of beer per week    Comment: beer a few times per week   Drug use: No       OPHTHALMIC EXAM:  Base Eye Exam     Visual Acuity (Snellen - Linear)       Right Left  Dist cc 20/25 -2 20/20 -2   Dist ph cc 20/20 -3     Correction: Glasses         Tonometry (Tonopen, 8:45 AM)       Right Left   Pressure 16 18         Pupils       Pupils Dark Light Shape React APD   Right PERRL 2 1 Round Brisk None   Left PERRL 2 1 Round Brisk None         Visual Fields       Left Right    Full Full         Extraocular Movement       Right Left    Full, Ortho Full, Ortho         Neuro/Psych     Oriented x3: Yes   Mood/Affect: Normal         Dilation     Both eyes: 2.5% Phenylephrine @ 8:46 AM           Slit Lamp and Fundus Exam     External Exam       Right Left   External Normal Normal         Slit Lamp Exam       Right Left   Lids/Lashes Dermatochalasis - upper lid Dermatochalasis - upper lid   Conjunctiva/Sclera White and quiet White and  quiet   Cornea 1-2+ fine Punctate epithelial erosions, Debris in tear film 2+ Punctate epithelial erosions   Anterior Chamber Deep, narrow temporal angle Deep and clear   Iris Round and dilated Round and moderately dilated   Lens 2+ Nuclear sclerosis, 2+ Cortical cataract 2-3+ Nuclear sclerosis, 2-3+ Cortical cataract   Anterior Vitreous Normal Normal         Fundus Exam       Right Left   Disc Pink and sharp, With cupping Pink and sharp, With cupping   C/D Ratio 0.8 0.6   Macula Blunted foveal reflex, Drusen, focal ERM with striae temporal mac Flat, Good foveal reflex, Drusen, RPE mottling, No heme or edema   Vessels Vascular attenuation, Tortuous Vascular attenuation, Tortuous   Periphery Attached, No heme, peripheral cystoid degeneration, pigmented atropic CR scar with tuft at 0200 Normal           IMAGING AND PROCEDURES  Imaging and Procedures for 09/26/2024  OCT, Retina - OU - Both Eyes       Right Eye Quality was good. Central Foveal Thickness: 398. Progression has no prior data. 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   Left Eye Quality was good. Central Foveal Thickness: 278. Progression has no prior data. Findings include normal foveal contour, no IRF, no SRF (Focal drusen inferior fovea).   Notes *Images captured and stored on drive  Diagnosis / Impression:  OD: ERM w/ mild pucker - temporal macula OS: Focal drusen inferior fovea  Clinical management:  See below  Abbreviations: NFP - Normal foveal profile. CME - cystoid macular edema. PED - pigment epithelial detachment. IRF - intraretinal fluid. SRF - subretinal fluid. EZ - ellipsoid zone. ERM - epiretinal membrane. ORA - outer retinal atrophy. ORT - outer retinal tubulation. SRHM - subretinal hyper-reflective material. IRHM - intraretinal hyper-reflective material           ASSESSMENT/PLAN:   ICD-10-CM   1. Epiretinal membrane (ERM) of right eye   H35.371 OCT, Retina - OU - Both Eyes  2. Essential hypertension  I10     3. Hypertensive retinopathy of both eyes  H35.033     4. Combined forms of age-related cataract of both eyes  H25.813     5. Glaucoma suspect of both eyes  H40.003      Epiretinal membrane, right eye  - The natural history, anatomy, potential for loss of vision, and treatment options including vitrectomy techniques and the complications of endophthalmitis, retinal detachment, vitreous hemorrhage, cataract progression and permanent vision loss discussed with the patient. - OCT OD shows ERM w/ mild pucker - temporal macula - BCVA OD 20/20 - asymptomatic, no metamorphopsia - no indication for surgery at this time - monitor for now - f/u in 3-4 mos -- DFE/OCT   2,3. Hypertensive retinopathy OU - discussed importance of tight BP control - monitor   4. Mixed Cataract OU - The symptoms of cataract, surgical options, and treatments and risks were discussed with patient. - discussed diagnosis and progression - under the expert management of Dr. Waylan - monitor   5. Glaucoma suspect  - +cupping of disc OU  - IOP 16,18  - under the expert management of Dr. Waylan  Ophthalmic Meds Ordered this visit:  No orders of the defined types were placed in this encounter.    Return in about 4 months (around 01/27/2025) for ERM OD, Dilated Exam, OCT.  There are no Patient Instructions on file for this visit.  Explained the diagnoses, plan, and follow up with the patient and they expressed understanding.  Patient expressed understanding of the importance of proper follow up care.   This document serves as a record of services personally performed by [name], MD, PhD. It was created on their behalf by Wanda GEANNIE Keens, COT an ophthalmic technician. The creation of this record is the provider's dictation and/or activities during the visit.    Electronically signed by:  Wanda GEANNIE Keens, COT  09/26/24 12:23  PM  [name], M.D., Ph.D. Diseases & Surgery of the Retina and Vitreous [facility] 09/26/2024  I have reviewed the above documentation for accuracy and completeness, and I agree with the above. [name], M.D., Ph.D. 09/26/24 12:23 PM   Abbreviations: M myopia (nearsighted); A astigmatism; H hyperopia (farsighted); P presbyopia; Mrx spectacle prescription;  CTL contact lenses; OD right eye; OS left eye; OU both eyes  XT exotropia; ET esotropia; PEK punctate epithelial keratitis; PEE punctate epithelial erosions; DES dry eye syndrome; MGD meibomian gland dysfunction; ATs artificial tears; PFAT's preservative free artificial tears; NSC nuclear sclerotic cataract; PSC posterior subcapsular cataract; ERM epi-retinal membrane; PVD posterior vitreous detachment; RD retinal detachment; DM diabetes mellitus; DR diabetic retinopathy; NPDR non-proliferative diabetic retinopathy; PDR proliferative diabetic retinopathy; CSME clinically significant macular edema; DME diabetic macular edema; dbh dot blot hemorrhages; CWS cotton wool spot; POAG primary open angle glaucoma; C/D cup-to-disc ratio; HVF humphrey visual field; GVF goldmann visual field; OCT optical coherence tomography; IOP intraocular pressure; BRVO Branch retinal vein occlusion; CRVO central retinal vein occlusion; CRAO central retinal artery occlusion; BRAO branch retinal artery occlusion; RT retinal tear; SB scleral buckle; PPV pars plana vitrectomy; VH Vitreous hemorrhage; PRP panretinal laser photocoagulation; IVK intravitreal kenalog ; VMT vitreomacular traction; MH Macular hole;  NVD neovascularization of the disc; NVE neovascularization elsewhere; AREDS age related eye disease study; ARMD age related macular degeneration; POAG primary open angle glaucoma; EBMD epithelial/anterior basement membrane dystrophy; ACIOL anterior chamber intraocular lens; IOL intraocular lens; PCIOL posterior chamber intraocular  lens;  Phaco/IOL phacoemulsification with intraocular lens placement; PRK photorefractive keratectomy; LASIK laser assisted in situ keratomileusis; HTN hypertension; DM diabetes mellitus; COPD chronic obstructive pulmonary disease

## 2024-10-15 NOTE — Progress Notes (Unsigned)
 CARDIOLOGY CONSULT NOTE       Patient ID: Steven Carpenter MRN: [id number] DOB/AGE: [birth year] 68 y.o.  Referring Physician: plotnicov Primary Physician: Steven Karlynn [name], MD Primary Cardiologist: Steven Carpenter: CAD/Dilated aorta   HPI:  68 y.o. referred by Dr Steven Carpenter for CAD on CT scan and dilated aorta. History of HTN and HLD Currently on norvasc  and valsartan  for HTN and lipitor for HLD. LDL 86 on 09/11/24   Reviewed his calcium  score from 09/03/24 3 vessel calcium  total score 367 which is 69 th percentile. Aorta measures 4.1. Echo done in 06/15/20 suggested mild LVH with ascending aorta 4.4 cm.  EF 55-60% with mild LVH AV described as tri leaflet with mild AR   He is leaving for catamaran solo trip for 6 months to [place] and [place]. Has done it last 7 years. He has no chest pain. Discussed increasing lipitor to 20 mg to reach target. He does more than 11,000 steps daily. His significant other for last 5 years Steven Carpenter is in health care and asks a lot of good questions. She has him on fish oil  ROS All other systems reviewed and negative except as noted above  Past Medical History:  Diagnosis Date  . Heart murmur    all of life  . Hx of adenomatous polyp of colon 02/01/2017  . Hyperlipidemia   . Hypertension     Family History  Problem Relation Age of Onset  . Hypertension Mother   . Cancer Mother        breast  . Hypertension Father   . Colon cancer Neg Hx   . Esophageal cancer Neg Hx   . Rectal cancer Neg Hx   . Stomach cancer Neg Hx   . Colon polyps Neg Hx     Social History   Socioeconomic History  . Marital status: Widowed    Spouse name: Not on file  . Number of children: Not on file  . Years of education: Not on file  . Highest education level: Not on file  Occupational History  . Not on file  Tobacco Use  . Smoking status: Never  . Smokeless tobacco: Never  Vaping Use  . Vaping status: Never Used  Substance and Sexual Activity  .  Alcohol use: Yes    Alcohol/week: 1.0 - 2.0 standard drink of alcohol    Types: 1 - 2 Cans of beer per week    Comment: beer a few times per week  . Drug use: No  . Sexual activity: Yes    Partners: Female  Other Topics Concern  . 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 Difficulty of Paying Living Expenses: Not hard at all  Food Insecurity: No Food Insecurity (02/18/2024)   Hunger Vital Sign   . Worried About Programme researcher, broadcasting/film/video in the Last Year: Never true   . Ran Out of Food in the Last Year: Never true  Transportation Needs: No Transportation Needs (02/18/2024)   PRAPARE - Transportation   . Lack of Transportation (Medical): No   . Lack of Transportation (Non-Medical): No  Physical Activity: Sufficiently Active (02/18/2024)   Exercise Vital Sign   . Days of Exercise per Week: 4 days   . Minutes of Exercise per Session:  120 min  Stress: No Stress Concern Present (02/18/2024)   [facility] [employer] - Occupational Stress Questionnaire   . Feeling of Stress : Not at all  Social Connections: Moderately Isolated (02/18/2024)   Social Connection and Isolation Panel   . Frequency of Communication with Friends and Family: More than three times a week   . Frequency of Social Gatherings with Friends and Family: More than three times a week   . Attends Religious Services: Never   . Active Member of Clubs or Organizations: Yes   . Attends Banker Meetings: 1 to 4 times per year   . Marital Status: Widowed  Intimate Partner Violence: Not At Risk (02/18/2024)   Humiliation, Afraid, Rape, and Kick questionnaire   . Fear of Current or Ex-Partner: No   . Emotionally Abused: No   . Physically Abused: No   . Sexually Abused: No    Past Surgical History:  Procedure  Laterality Date  . APPENDECTOMY    . EXTRACORPOREAL SHOCK WAVE LITHOTRIPSY Left 01/06/2021   Procedure: EXTRACORPOREAL SHOCK WAVE LITHOTRIPSY (ESWL);  Surgeon: Devere Lonni Righter, MD;  Location: [facility];  Service: Urology;  Laterality: Left;  . INGUINAL HERNIA REPAIR  08/06/12   bilateral      Current Outpatient Medications:  .  amLODipine  (NORVASC ) 5 MG tablet, TAKE 2 TABLETS DAILY, Disp: 180 tablet, Rfl: 3 .  aspirin  EC 81 MG tablet, Take 1 tablet (81 mg total) by mouth daily. (Patient taking differently: Take 81 mg by mouth as needed.), Disp: , Rfl:  .  atorvastatin  (LIPITOR) 10 MG tablet, TAKE 1 TABLET DAILY, Disp: 90 tablet, Rfl: 3 .  tamsulosin  (FLOMAX ) 0.4 MG CAPS capsule, Take 0.4 mg by mouth daily., Disp: , Rfl:  .  valsartan  (DIOVAN ) 320 MG tablet, TAKE 1 TABLET DAILY, Disp: 90 tablet, Rfl: 3    Physical Exam: Blood pressure 116/70, pulse 72, resp. rate 16, height 5' 8 (1.727 m), weight 185 lb 12.8 oz (84.3 kg), SpO2 94%.    Affect appropriate Healthy:  appears stated age HEENT: normal Neck supple with no adenopathy JVP normal no bruits no thyromegaly Lungs clear with no wheezing and good diaphragmatic motion Heart:  S1/S2   murmur, no rub, gallop or click PMI normal Abdomen: benighn, BS positve, no tenderness, no AAA no bruit.  No HSM or HJR Distal pulses intact with no bruits No edema Neuro non-focal Skin warm and dry No muscular weakness   Labs:   Lab Results  Component Value Date   WBC 5.6 09/11/2024   HGB 14.1 09/11/2024   HCT 42.4 09/11/2024   MCV 87.9 09/11/2024   PLT 181.0 09/11/2024   No results for input(s): NA, K, CL, CO2, BUN, CREATININE, CALCIUM , PROT, BILITOT, ALKPHOS, ALT, AST, GLUCOSE in the last 168 hours.  Invalid input(s): LABALBU No results found for: CKTOTAL, CKMB, CKMBINDEX, TROPONINI  Lab Results  Component Value Date   CHOL 170 09/11/2024   CHOL 177 08/24/2023   CHOL  193 08/23/2022   Lab Results  Component Value Date   HDL 71.60 09/11/2024   HDL 66.60 08/24/2023   HDL 82.50 08/23/2022   Lab Results  Component Value Date   LDLCALC 86 09/11/2024   LDLCALC 99 08/24/2023   LDLCALC 97 08/23/2022   Lab Results  Component Value Date   TRIG 58.0 09/11/2024   TRIG 55.0 08/24/2023   TRIG 67.0 08/23/2022   Lab Results  Component Value Date   CHOLHDL  2 09/11/2024   CHOLHDL 3 08/24/2023   CHOLHDL 2 08/23/2022   Lab Results  Component Value Date   LDLDIRECT 176.9 12/15/2013   LDLDIRECT 149.2 12/09/2012   LDLDIRECT 137.3 12/08/2011      Radiology: OCT, Retina - OU - Both Eyes Result Date: 09/26/2024 Right Eye Quality was good. Central Foveal Thickness: 398. Progression has no prior data. 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 Left Eye Quality was good. Central Foveal Thickness: 278. Progression has no prior data. Findings include normal foveal contour, no IRF, no SRF (Focal drusen inferior fovea). 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 CME - cystoid macular edema. PED - pigment epithelial detachment. IRF - intraretinal fluid. SRF - subretinal fluid. EZ - ellipsoid zone. ERM - epiretinal membrane. ORA - outer retinal atrophy. ORT - outer retinal tubulation. SRHM - subretinal hyper-reflective material. IRHM - intraretinal hyper-reflective material    EKG: SB normal 10/20/19 SR minimal voltage for LVH 10/16/2024    ASSESSMENT AND PLAN:   CAD:  sub clinical with high calcium  score for age On statin and 81 mg ASA  given score of close to 400 and solo travel will get ETT and make sure its normal  HTN:  continue diovan  and norvasc  HLD:  on lipitor 10 mg increase to 20 mg and check labs in 3 months  Dilated aorta:  4.1 cm on non  contrast calcium  score. Noted to be higher on echo 2021.  Mild AR with tri leaflet AV will update echo and repeat non contrast chest CT in a year. BP control as above  ETT Echo for AR Lipid/Liver in 3 months   F/U in a year  Signed: Maude Emmer 10/16/2024, 3:19 PM

## 2024-10-16 ENCOUNTER — Ambulatory Visit: Attending: Cardiovascular Disease | Admitting: Cardiovascular Disease

## 2024-10-16 VITALS — BP 116/70 | HR 72 | Resp 16 | Ht 68.0 in | Wt 185.8 lb

## 2024-10-16 DIAGNOSIS — E785 Hyperlipidemia, unspecified: Secondary | ICD-10-CM | POA: Insufficient documentation

## 2024-10-16 DIAGNOSIS — I77810 Thoracic aortic ectasia: Secondary | ICD-10-CM | POA: Insufficient documentation

## 2024-10-16 DIAGNOSIS — I351 Nonrheumatic aortic (valve) insufficiency: Secondary | ICD-10-CM | POA: Diagnosis present

## 2024-10-16 DIAGNOSIS — I2583 Coronary atherosclerosis due to lipid rich plaque: Secondary | ICD-10-CM | POA: Insufficient documentation

## 2024-10-16 MED ORDER — ATORVASTATIN CALCIUM 20 MG PO TABS
20.0000 mg | ORAL_TABLET | Freq: Every day | ORAL | 3 refills | Status: AC
Start: 2024-10-16 — End: 2025-01-14

## 2024-10-16 NOTE — Patient Instructions (Signed)
 Medication Instructions:  INCREASE atorvastatin  to 20mg  daily  *If you need a refill on your cardiac medications before your next appointment, please call your pharmacy*  Lab Work: FASTING lab work in 3 months -- lipid panel, liver function test  If you have labs (blood work) drawn today and your tests are completely normal, you will receive your results only by: MyChart Message (if you have MyChart) OR A paper copy in the mail If you have any lab test that is abnormal or we need to change your treatment, we will call you to review the results.  Testing/Procedures: Your physician has requested that you have an echocardiogram. Echocardiography is a painless test that uses sound waves to create images of your heart. It provides your doctor with information about the size and shape of your heart and how well your heart's chambers and valves are working. This procedure takes approximately one hour. There are no restrictions for this procedure. Please do NOT wear cologne, perfume, aftershave, or lotions (deodorant is allowed). Please arrive 15 minutes prior to your appointment time.  Please note: We ask at that you not bring children with you during ultrasound (echo/ vascular) testing. Due to room size and safety concerns, children are not allowed in the ultrasound rooms during exams. Our front office staff cannot provide observation of children in our lobby area while testing is being conducted. An adult accompanying a patient to their appointment will only be allowed in the ultrasound room at the discretion of the ultrasound technician under special circumstances. We apologize for any inconvenience.  Exercise Stress Test An exercise stress test is a test to check how your heart works during exercise. You will need to walk on a treadmill or ride an exercise bike for this test. An electrocardiogram (ECG) will record your heartbeat when you are at rest and when you are exercising. You may have an  ultrasound or nuclear scan after the exercise test. The test is done to check for coronary artery disease (CAD). It is also done to: See how well you can exercise. Watch for high blood pressure during exercise. Test how well you can exercise after treatment. Check the blood flow to your arms and legs. If your test result is not normal, more testing may be needed. Tell a doctor about: Any allergies you have. All medicines you are taking. This includes vitamins, herbs, eye drops, creams, and over-the-counter medicines. Any surgeries you have had. Tell your doctor if you have a pacemaker or a defibrillator called an ICD. Any bleeding problems you have. Any medical conditions you have. Whether you are pregnant or may be pregnant. What are the risks? Pain or pressure in the following areas: Chest. Jaw or neck. Between your shoulder blades. Down your left arm. Legs. Feeling dizzy or light-headed. Shortness of breath. Irregular heartbeat. Feeling like you may vomit (nausea) or vomiting. What happens before the test? Follow instructions from your doctor about what you cannot eat or drink. Do not have any drinks or foods that have caffeine in them for 24 hours before the test, or as told by your doctor. This includes coffee, tea (even decaf tea), sodas, chocolate, and cocoa. Ask your doctor about changing or stopping: Over-the-counter medicines. Vitamins, herbs, and supplements. Your normal medicines. This is important if: You take diabetes medicines. Ask how to take insulin or pills. You may be told to change your insulin dose the morning of the test. You take beta-blocker medicines. These medicines may cause problems in your  test. You may be told to stop taking them 24 hours before the test. You wear a nitroglycerin patch. The patch may need to be taken off before the test. Do not smoke or use any products that contain nicotine or tobacco for 4 hours before the test. If you need help  quitting, ask your doctor. If you use an inhaler, bring it with you to the test. Do not put lotions, powders, creams, or oils on your chest before the test. Wear comfortable shoes and loose-fitting clothing. What happens during the test?  Patches (electrodes) will be put on your chest. Wires will be connected to the patches. The wires will send signals to a machine to record your heartbeat. Your heart rate will be watched while you are resting and while you are exercising. Your blood pressure and oxygen levels will also be watched during the test. You will walk on a treadmill or use an exercise bike. If you cannot use these, you may be asked to turn a crank with your hands. The activity will get harder and will raise your heart rate. You may be asked to breathe into a tube a few times during the test. This measures the gases that you breathe out. You will be asked how you are feeling throughout the test. You will exercise until your heart reaches a target heart rate. You will stop early if: You have chest pain. You feel dizzy. You are out of breath. You are too tired to keep going. Your blood pressure is too high or too low. You have an irregular heartbeat. You have pain or aching in your arms or legs. The test may vary among doctors and hospitals. What can I expect after the test? You will be monitored until you leave the hospital or clinic. This includes checking your blood pressure, heart rate, breathing rate, and blood oxygen level. You may return to your normal diet and activities as told by your doctor. It is up to you to get the results of your test. Ask how to get your results when they are ready. Summary An exercise stress test is a test to check how your heart works during exercise. This test is done to check for coronary artery disease. Your heart rate will be watched while you are resting and while you are exercising. Follow instructions from your doctor about what you  cannot eat or drink before the test. This information is not intended to replace advice given to you by your health care provider. Make sure you discuss any questions you have with your health care provider. Document Revised: 10/25/2021 Document Reviewed: 10/25/2021 Elsevier Patient Education  2024 [employer]  Follow-Up: At [facility], you and your health needs are our priority.  As part of our continuing mission to provide you with exceptional heart care, our providers are all part of one team.  This team includes your primary Cardiologist (physician) and Advanced Practice Providers or APPs (Physician Assistants and Nurse Practitioners) who all work together to provide you with the care you need, when you need it.  Your next appointment:    12 months with Dr. Delford  We recommend signing up for the patient portal called MyChart.  Sign up information is provided on this After Visit Summary.  MyChart is used to connect with patients for Virtual Visits (Telemedicine).  Patients are able to view lab/test results, encounter notes, upcoming appointments, etc.  Non-urgent messages can be sent to your provider as well.   To  learn more about what you can do with MyChart, go to [email or web address].   Other Instructions

## 2024-10-23 ENCOUNTER — Ambulatory Visit: Attending: Cardiovascular Disease

## 2024-10-23 ENCOUNTER — Encounter (HOSPITAL_COMMUNITY): Payer: Self-pay | Admitting: *Deleted

## 2024-10-23 DIAGNOSIS — I77810 Thoracic aortic ectasia: Secondary | ICD-10-CM | POA: Insufficient documentation

## 2024-10-23 DIAGNOSIS — I351 Nonrheumatic aortic (valve) insufficiency: Secondary | ICD-10-CM | POA: Insufficient documentation

## 2024-10-23 LAB — ECHOCARDIOGRAM COMPLETE
AR max vel: 2.59 cm2
AV Peak grad: 11 mmHg
AV Vena cont: 0.8 cm
Ao pk vel: 1.66 m/s
Area-P 1/2: 2.27 cm2
Calc EF: 57.6 %
P 1/2 time: 685 ms
S' Lateral: 3.2 cm
Single Plane A2C EF: 54.4 %
Single Plane A4C EF: 57.9 %

## 2024-10-24 ENCOUNTER — Ambulatory Visit: Payer: Self-pay | Admitting: Cardiovascular Disease

## 2024-10-31 ENCOUNTER — Ambulatory Visit (HOSPITAL_COMMUNITY)
Admission: RE | Admit: 2024-10-31 | Discharge: 2024-10-31 | Disposition: A | Discharge status: Home / Self Care | Source: Ambulatory Visit | Attending: Internal Medicine | Admitting: Internal Medicine

## 2024-10-31 DIAGNOSIS — I2583 Coronary atherosclerosis due to lipid rich plaque: Secondary | ICD-10-CM | POA: Insufficient documentation

## 2024-10-31 DIAGNOSIS — I2510 Atherosclerotic heart disease of native coronary artery without angina pectoris: Secondary | ICD-10-CM | POA: Insufficient documentation

## 2024-10-31 LAB — EXERCISE TOLERANCE TEST
Angina Index: 0
Estimated workload: 13.4
Exercise duration (min): 12 min
Exercise duration (sec): 0 s
MPHR: 151 {beats}/min
Peak HR: 153 {beats}/min
Percent HR: 101 %
RPE: 18
Rest HR: 71 {beats}/min

## 2024-11-10 ENCOUNTER — Ambulatory Visit: Admitting: Cardiovascular Disease

## 2024-11-28 ENCOUNTER — Other Ambulatory Visit: Payer: Self-pay | Admitting: Internal Medicine

## 2025-01-27 ENCOUNTER — Encounter (INDEPENDENT_AMBULATORY_CARE_PROVIDER_SITE_OTHER): Admitting: Ophthalmology

## 2025-04-13 ENCOUNTER — Encounter (INDEPENDENT_AMBULATORY_CARE_PROVIDER_SITE_OTHER): Admitting: Ophthalmology

## 2025-09-14 ENCOUNTER — Ambulatory Visit: Admitting: Internal Medicine

## 2025-09-14 ENCOUNTER — Ambulatory Visit: Payer: Medicare Other
